# Patient Record
Sex: Female | Born: 1945
Health system: Southern US, Community
[De-identification: ages and names within clinical notes are randomized; demographics above are authoritative.]

## PROBLEM LIST (undated history)

## (undated) DIAGNOSIS — M4800 Spinal stenosis, site unspecified: Secondary | ICD-10-CM

## (undated) DIAGNOSIS — J302 Other seasonal allergic rhinitis: Secondary | ICD-10-CM

## (undated) HISTORY — PX: OTHER SURGICAL HISTORY: SHX169

## (undated) HISTORY — PX: TUBAL LIGATION: SHX77

---

## 1998-04-11 ENCOUNTER — Ambulatory Visit (HOSPITAL_BASED_OUTPATIENT_CLINIC_OR_DEPARTMENT_OTHER): Admission: RE | Admit: 1998-04-11 | Discharge: 1998-04-11 | Payer: Self-pay | Admitting: Orthopedic Surgery

## 1998-06-13 ENCOUNTER — Ambulatory Visit (HOSPITAL_BASED_OUTPATIENT_CLINIC_OR_DEPARTMENT_OTHER): Admission: RE | Admit: 1998-06-13 | Discharge: 1998-06-13 | Payer: Self-pay | Admitting: Orthopedic Surgery

## 2002-05-18 ENCOUNTER — Encounter: Payer: Self-pay | Admitting: Obstetrics & Gynecology

## 2002-05-18 ENCOUNTER — Ambulatory Visit (HOSPITAL_COMMUNITY): Admission: RE | Admit: 2002-05-18 | Discharge: 2002-05-18 | Payer: Self-pay | Admitting: Obstetrics & Gynecology

## 2003-05-28 ENCOUNTER — Ambulatory Visit (HOSPITAL_COMMUNITY): Admission: RE | Admit: 2003-05-28 | Discharge: 2003-05-28 | Payer: Self-pay | Admitting: Obstetrics & Gynecology

## 2012-03-08 DIAGNOSIS — J309 Allergic rhinitis, unspecified: Secondary | ICD-10-CM | POA: Diagnosis not present

## 2012-03-24 DIAGNOSIS — J309 Allergic rhinitis, unspecified: Secondary | ICD-10-CM | POA: Diagnosis not present

## 2012-04-01 DIAGNOSIS — J309 Allergic rhinitis, unspecified: Secondary | ICD-10-CM | POA: Diagnosis not present

## 2012-04-06 DIAGNOSIS — J3089 Other allergic rhinitis: Secondary | ICD-10-CM | POA: Diagnosis not present

## 2012-04-07 DIAGNOSIS — J309 Allergic rhinitis, unspecified: Secondary | ICD-10-CM | POA: Diagnosis not present

## 2012-04-21 DIAGNOSIS — J309 Allergic rhinitis, unspecified: Secondary | ICD-10-CM | POA: Diagnosis not present

## 2012-04-25 DIAGNOSIS — J309 Allergic rhinitis, unspecified: Secondary | ICD-10-CM | POA: Diagnosis not present

## 2012-05-05 DIAGNOSIS — J309 Allergic rhinitis, unspecified: Secondary | ICD-10-CM | POA: Diagnosis not present

## 2012-05-12 DIAGNOSIS — J309 Allergic rhinitis, unspecified: Secondary | ICD-10-CM | POA: Diagnosis not present

## 2012-05-19 DIAGNOSIS — J309 Allergic rhinitis, unspecified: Secondary | ICD-10-CM | POA: Diagnosis not present

## 2012-05-31 DIAGNOSIS — J309 Allergic rhinitis, unspecified: Secondary | ICD-10-CM | POA: Diagnosis not present

## 2012-05-31 DIAGNOSIS — Z23 Encounter for immunization: Secondary | ICD-10-CM | POA: Diagnosis not present

## 2012-06-06 DIAGNOSIS — R319 Hematuria, unspecified: Secondary | ICD-10-CM | POA: Diagnosis not present

## 2012-06-06 DIAGNOSIS — R109 Unspecified abdominal pain: Secondary | ICD-10-CM | POA: Diagnosis not present

## 2012-06-08 DIAGNOSIS — J309 Allergic rhinitis, unspecified: Secondary | ICD-10-CM | POA: Diagnosis not present

## 2012-06-09 ENCOUNTER — Ambulatory Visit (HOSPITAL_COMMUNITY)
Admission: RE | Admit: 2012-06-09 | Discharge: 2012-06-09 | Disposition: A | Discharge status: Home / Self Care | Payer: Medicare Other | Source: Ambulatory Visit | Attending: Internal Medicine | Admitting: Internal Medicine

## 2012-06-09 ENCOUNTER — Other Ambulatory Visit (HOSPITAL_COMMUNITY): Payer: Self-pay | Admitting: Internal Medicine

## 2012-06-09 DIAGNOSIS — R109 Unspecified abdominal pain: Secondary | ICD-10-CM | POA: Diagnosis not present

## 2012-06-09 DIAGNOSIS — R1084 Generalized abdominal pain: Secondary | ICD-10-CM

## 2012-06-09 DIAGNOSIS — M47817 Spondylosis without myelopathy or radiculopathy, lumbosacral region: Secondary | ICD-10-CM | POA: Diagnosis not present

## 2012-06-09 DIAGNOSIS — K5730 Diverticulosis of large intestine without perforation or abscess without bleeding: Secondary | ICD-10-CM | POA: Diagnosis not present

## 2012-06-09 LAB — POCT I-STAT, CHEM 8
BUN: 15 mg/dL (ref 6–23)
Calcium, Ion: 1.23 mmol/L (ref 1.13–1.30)
Chloride: 103 mEq/L (ref 96–112)
Creatinine, Ser: 0.7 mg/dL (ref 0.50–1.10)
Glucose, Bld: 93 mg/dL (ref 70–99)
HCT: 42 % (ref 36.0–46.0)
Hemoglobin: 14.3 g/dL (ref 12.0–15.0)
Potassium: 3.9 mEq/L (ref 3.5–5.1)
Sodium: 140 mEq/L (ref 135–145)
TCO2: 28 mmol/L (ref 0–100)

## 2012-06-09 MED ORDER — IOHEXOL 300 MG/ML  SOLN
100.0000 mL | Freq: Once | INTRAMUSCULAR | Status: AC | PRN
  Administered 2012-06-09: 100 mL via INTRAVENOUS

## 2012-06-20 DIAGNOSIS — J309 Allergic rhinitis, unspecified: Secondary | ICD-10-CM | POA: Diagnosis not present

## 2012-06-28 ENCOUNTER — Encounter (INDEPENDENT_AMBULATORY_CARE_PROVIDER_SITE_OTHER): Payer: Self-pay | Admitting: *Deleted

## 2012-06-28 ENCOUNTER — Emergency Department (HOSPITAL_BASED_OUTPATIENT_CLINIC_OR_DEPARTMENT_OTHER)
Admission: EM | Admit: 2012-06-28 | Discharge: 2012-06-28 | Disposition: A | Discharge status: Home / Self Care | Payer: Medicare Other | Attending: Emergency Medicine | Admitting: Emergency Medicine

## 2012-06-28 ENCOUNTER — Encounter (HOSPITAL_BASED_OUTPATIENT_CLINIC_OR_DEPARTMENT_OTHER): Payer: Self-pay | Admitting: *Deleted

## 2012-06-28 ENCOUNTER — Emergency Department (HOSPITAL_BASED_OUTPATIENT_CLINIC_OR_DEPARTMENT_OTHER): Payer: Medicare Other

## 2012-06-28 DIAGNOSIS — Z79899 Other long term (current) drug therapy: Secondary | ICD-10-CM | POA: Insufficient documentation

## 2012-06-28 DIAGNOSIS — M549 Dorsalgia, unspecified: Secondary | ICD-10-CM | POA: Diagnosis not present

## 2012-06-28 DIAGNOSIS — R142 Eructation: Secondary | ICD-10-CM | POA: Insufficient documentation

## 2012-06-28 DIAGNOSIS — R141 Gas pain: Secondary | ICD-10-CM | POA: Insufficient documentation

## 2012-06-28 DIAGNOSIS — R109 Unspecified abdominal pain: Secondary | ICD-10-CM | POA: Diagnosis not present

## 2012-06-28 DIAGNOSIS — R1011 Right upper quadrant pain: Secondary | ICD-10-CM | POA: Diagnosis not present

## 2012-06-28 LAB — CBC WITH DIFFERENTIAL/PLATELET
Basophils Relative: 0 % (ref 0–1)
Eosinophils Absolute: 0.2 10*3/uL (ref 0.0–0.7)
Eosinophils Relative: 2 % (ref 0–5)
HCT: 38.4 % (ref 36.0–46.0)
Hemoglobin: 12.9 g/dL (ref 12.0–15.0)
MCH: 29.9 pg (ref 26.0–34.0)
MCHC: 33.6 g/dL (ref 30.0–36.0)
Monocytes Absolute: 0.5 10*3/uL (ref 0.1–1.0)
Monocytes Relative: 7 % (ref 3–12)

## 2012-06-28 LAB — URINALYSIS, ROUTINE W REFLEX MICROSCOPIC
Bilirubin Urine: NEGATIVE
Glucose, UA: NEGATIVE mg/dL
Ketones, ur: NEGATIVE mg/dL
Nitrite: NEGATIVE
Protein, ur: NEGATIVE mg/dL
Specific Gravity, Urine: 1.016 (ref 1.005–1.030)
Urobilinogen, UA: 1 mg/dL (ref 0.0–1.0)
pH: 6 (ref 5.0–8.0)

## 2012-06-28 LAB — COMPREHENSIVE METABOLIC PANEL
Albumin: 3.8 g/dL (ref 3.5–5.2)
BUN: 13 mg/dL (ref 6–23)
Creatinine, Ser: 0.6 mg/dL (ref 0.50–1.10)
Total Protein: 7.2 g/dL (ref 6.0–8.3)

## 2012-06-28 LAB — URINE MICROSCOPIC-ADD ON

## 2012-06-28 MED ORDER — HYDROCODONE-ACETAMINOPHEN 5-325 MG PO TABS: 2.0000 | ORAL_TABLET | ORAL | Status: DC | PRN

## 2012-06-28 MED ORDER — CIPROFLOXACIN HCL 500 MG PO TABS: 500.0000 mg | ORAL_TABLET | Freq: Two times a day (BID) | ORAL | Status: DC

## 2012-06-28 NOTE — ED Provider Notes (Signed)
History     CSN: [id number]  Arrival date & time 06/28/12  1348   First MD Initiated Contact with Patient 06/28/12 1508      Chief Complaint  Patient presents with  . Flank Pain    (Consider location/radiation/quality/duration/timing/severity/associated sxs/prior treatment) HPI Comments: Pt with 3 week hx of right flank pain.  Has a constant burning pain to area that waxes and wanes in intensity.  No urinary symptoms.  No related to eating.  Normal bowel movements.  No fevers.  No rashes.  Slight worse with movement.  Pain is a sharp, burning pain with associated numbness to RUQ, radiates to upper back.  Was seen by her PMD at [facility], had CT scan that did not show etiology for the pain.  Was taking some pain medications with some relief.  Has appt on Jan 9th to see a GI specialist.  Patient is a 66 y.o. female presenting with flank pain.  Flank Pain Associated symptoms include abdominal pain. Pertinent negatives include no chest pain, no headaches and no shortness of breath.    History reviewed. No pertinent past medical history.  History reviewed. No pertinent past surgical history.  History reviewed. No pertinent family history.  History  Substance Use Topics  . Smoking status: Never Smoker   . Smokeless tobacco: Not on file  . Alcohol Use: No    OB History    Grav Para Term Preterm Abortions TAB SAB Ect Mult Living                  Review of Systems  Constitutional: Negative for fever, chills, diaphoresis and fatigue.  HENT: Negative for congestion, rhinorrhea and sneezing.   Eyes: Negative.   Respiratory: Negative for cough, chest tightness and shortness of breath.   Cardiovascular: Negative for chest pain and leg swelling.  Gastrointestinal: Positive for abdominal pain and abdominal distention. Negative for nausea, vomiting, diarrhea and blood in stool.  Genitourinary: Negative for frequency, hematuria, flank pain and difficulty urinating.  Musculoskeletal:  Positive for back pain. Negative for arthralgias.  Skin: Negative for rash.  Neurological: Negative for dizziness, speech difficulty, weakness, numbness and headaches.    Allergies  Review of patient's allergies indicates no known allergies.  Home Medications   Current Outpatient Rx  Name  Route  Sig  Dispense  Refill  . CIPROFLOXACIN HCL 500 MG PO TABS   Oral   Take 1 tablet (500 mg total) by mouth 2 (two) times daily.   14 tablet   0   . HYDROCODONE-ACETAMINOPHEN 5-325 MG PO TABS   Oral   Take 2 tablets by mouth every 4 (four) hours as needed for pain.   15 tablet   0     BP 138/83  Pulse 78  Temp 98.6 F (37 C) (Oral)  Resp 16  SpO2 98%  Physical Exam  Constitutional: She is oriented to person, place, and time. She appears well-developed and well-nourished.  HENT:  Head: Normocephalic and atraumatic.  Eyes: Pupils are equal, round, and reactive to light.  Neck: Normal range of motion. Neck supple.  Cardiovascular: Normal rate, regular rhythm and normal heart sounds.   Pulmonary/Chest: Effort normal and breath sounds normal. No respiratory distress. She has no wheezes. She has no rales. She exhibits no tenderness.  Abdominal: Soft. Bowel sounds are normal. There is tenderness (mild TTP right mid abdomen/upper abdomen.  no CVA tenderness.  no rash.  no pain along spine). There is no rebound and no guarding.  Musculoskeletal: Normal range of motion. She exhibits no edema.  Lymphadenopathy:    She has no cervical adenopathy.  Neurological: She is alert and oriented to person, place, and time.  Skin: Skin is warm and dry. No rash noted.  Psychiatric: She has a normal mood and affect.    ED Course  Procedures (including critical care time)  Results for orders placed during the hospital encounter of 06/28/12  URINALYSIS, ROUTINE W REFLEX MICROSCOPIC      Component Value Range   Color, Urine YELLOW  YELLOW   APPearance CLEAR  CLEAR   Specific Gravity, Urine 1.016   1.005 - 1.030   pH 6.0  5.0 - 8.0   Glucose, UA NEGATIVE  NEGATIVE mg/dL   Hgb urine dipstick SMALL (*) NEGATIVE   Bilirubin Urine NEGATIVE  NEGATIVE   Ketones, ur NEGATIVE  NEGATIVE mg/dL   Protein, ur NEGATIVE  NEGATIVE mg/dL   Urobilinogen, UA 1.0  0.0 - 1.0 mg/dL   Nitrite NEGATIVE  NEGATIVE   Leukocytes, UA SMALL (*) NEGATIVE  URINE MICROSCOPIC-ADD ON      Component Value Range   Squamous Epithelial / LPF FEW (*) RARE   WBC, UA 11-20  <3 WBC/hpf   RBC / HPF 0-2  <3 RBC/hpf   Bacteria, UA FEW (*) RARE  CBC WITH DIFFERENTIAL      Component Value Range   WBC 7.9  4.0 - 10.5 K/uL   RBC 4.31  3.87 - 5.11 MIL/uL   Hemoglobin 12.9  12.0 - 15.0 g/dL   HCT 40.9  81.1 - 91.4 %   MCV 89.1  78.0 - 100.0 fL   MCH 29.9  26.0 - 34.0 pg   MCHC 33.6  30.0 - 36.0 g/dL   RDW 78.2  95.6 - 21.3 %   Platelets 231  150 - 400 K/uL   Neutrophils Relative 64  43 - 77 %   Neutro Abs 5.1  1.7 - 7.7 K/uL   Lymphocytes Relative 26  12 - 46 %   Lymphs Abs 2.1  0.7 - 4.0 K/uL   Monocytes Relative 7  3 - 12 %   Monocytes Absolute 0.5  0.1 - 1.0 K/uL   Eosinophils Relative 2  0 - 5 %   Eosinophils Absolute 0.2  0.0 - 0.7 K/uL   Basophils Relative 0  0 - 1 %   Basophils Absolute 0.0  0.0 - 0.1 K/uL  COMPREHENSIVE METABOLIC PANEL      Component Value Range   Sodium 139  135 - 145 mEq/L   Potassium 4.1  3.5 - 5.1 mEq/L   Chloride 101  96 - 112 mEq/L   CO2 26  19 - 32 mEq/L   Glucose, Bld 97  70 - 99 mg/dL   BUN 13  6 - 23 mg/dL   Creatinine, Ser 0.86  0.50 - 1.10 mg/dL   Calcium 9.3  8.4 - 57.8 mg/dL   Total Protein 7.2  6.0 - 8.3 g/dL   Albumin 3.8  3.5 - 5.2 g/dL   AST 15  0 - 37 U/L   ALT 18  0 - 35 U/L   Alkaline Phosphatase 89  39 - 117 U/L   Total Bilirubin 0.3  0.3 - 1.2 mg/dL   GFR calc non Af Amer >90  >90 mL/min   GFR calc Af Amer >90  >90 mL/min   US Abdomen Complete  06/28/2012  *RADIOLOGY REPORT*  Clinical Data:  Right upper quadrant pain.  COMPLETE ABDOMINAL ULTRASOUND   Comparison:  CT 06/09/2012  Findings:  Gallbladder:  No gallstones, gallbladder wall thickening, or pericholecystic fluid.  Common bile duct:   Normal caliber, 5 mm.  Liver:  No focal lesion identified.  Within normal limits in parenchymal echogenicity.  IVC:  Appears normal.  Pancreas:  No focal abnormality seen.  Spleen:  Within normal limits in size and echotexture.  Right Kidney:   Normal in size and parenchymal echogenicity.  No evidence of mass or hydronephrosis.  Left Kidney:  Areas of cortical thinning/scarring in the mid to lower pole as seen on prior CT.  11.4 cm in craniocaudal length. No hydronephrosis.  Abdominal aorta:  No aneurysm identified.  IMPRESSION: No acute findings or significant abnormality.   Original Report Authenticated By: Charlett Nose, M.D.    Ct Abdomen Pelvis W Contrast  06/09/2012  *RADIOLOGY REPORT*  Clinical Data: Intermittent right-sided abdominal pain and numbness.  CT ABDOMEN AND PELVIS WITH CONTRAST  Technique:  Multidetector CT imaging of the abdomen and pelvis was performed following the standard protocol during bolus administration of intravenous contrast.  Contrast: OMNIPAQUE IOHEXOL 300 MG/ML  SOLN  Comparison: None.  Findings: The lung bases are clear without focal nodule, mass, or airspace disease.  The heart size is normal.  No significant pleural or pericardial effusion is present.  The liver and spleen are within normal limits.  The stomach, stomach is within normal limits.  A small duodenal diverticulum is present without associated inflammatory change.  Mild fatty infiltration of the pancreas is present.  There are no focal pancreatic lesions.  The common bile duct and gallbladder are within normal limits.  The adrenal glands are normal bilaterally. Mild renal atrophy is evident within the anterolateral left kidney. This could be secondary to prior infection.  Ureters are within normal limits.  Urinary bladder is normal.  The rectosigmoid colon demonstrates  mild diverticular changes without focal inflammation.  Diverticular changes are present throughout the descending colon as well.  The more proximal colon is within normal limits.  The appendix is not discretely visualized and may be surgically absent.  The small bowel is unremarkable. The uterus and adnexa are within normal limits for age.  No significant adenopathy or free fluid is present.  The bone windows demonstrate a moderate facet degenerative changes and anterolisthesis at L4-5.  Slight retrolisthesis is present at L2-3.  Multilevel endplate changes are noted.  Slight leftward curvature is evident.  Chronic end plate changes are present at T10- 11.  Degenerative changes are noted in the SI joints bilaterally.  IMPRESSION:  1.  Sigmoid diverticulosis without diverticulitis. 2.  Moderate spondylosis in the lumbar spine. 3.  No acute or focal abnormality to explain the patient's right- sided symptoms. 4.  Small duodenal diverticulum without focal inflammatory changes to suggest duodenitis. 5.  The appendix is not clearly visualized although no secondary signs of appendicitis are evident.   Original Report Authenticated By: Marin Roberts, M.D.       1. Abdominal  pain, other specified site       MDM  Patient with three-week history of right flank pain. She had a recent CT scan is unremarkable. I did do an ultrasound today that did not show gallbladder disease. Her labs are unremarkable. Her urine has some signs of infection so I feel it is worthwhile treating her with a course of antibiotics for this. Her urine was also sent for culture. She has a followup appointment with the GI  specialist. I advised to return here if her symptoms worsen.        Rolan Bucco, MD 06/28/12 [phone]

## 2012-06-28 NOTE — ED Notes (Signed)
Pt states that she has been having right sided flank and abd pain states that pain is a burning and stinging pain as well as numbness. Pt state that she has seen her PCP and had a CT Scan at [facility] that was -. Pt states that she feels as though she is not urinating as much as she should

## 2012-07-01 LAB — URINE CULTURE

## 2012-07-02 NOTE — ED Notes (Signed)
+  Urine. Patient treated with Cipro. Sensitive to same. Per protocol MD. °

## 2012-07-05 DIAGNOSIS — R1031 Right lower quadrant pain: Secondary | ICD-10-CM | POA: Diagnosis not present

## 2012-07-05 DIAGNOSIS — N949 Unspecified condition associated with female genital organs and menstrual cycle: Secondary | ICD-10-CM | POA: Diagnosis not present

## 2012-07-05 DIAGNOSIS — Z124 Encounter for screening for malignant neoplasm of cervix: Secondary | ICD-10-CM | POA: Diagnosis not present

## 2012-07-05 DIAGNOSIS — N8500 Endometrial hyperplasia, unspecified: Secondary | ICD-10-CM | POA: Diagnosis not present

## 2012-07-05 DIAGNOSIS — N9489 Other specified conditions associated with female genital organs and menstrual cycle: Secondary | ICD-10-CM | POA: Diagnosis not present

## 2012-07-07 ENCOUNTER — Telehealth (INDEPENDENT_AMBULATORY_CARE_PROVIDER_SITE_OTHER): Payer: Self-pay | Admitting: *Deleted

## 2012-07-07 ENCOUNTER — Encounter (INDEPENDENT_AMBULATORY_CARE_PROVIDER_SITE_OTHER): Payer: Self-pay | Admitting: Internal Medicine

## 2012-07-07 ENCOUNTER — Ambulatory Visit (INDEPENDENT_AMBULATORY_CARE_PROVIDER_SITE_OTHER): Payer: Medicare Other | Admitting: Internal Medicine

## 2012-07-07 ENCOUNTER — Other Ambulatory Visit (INDEPENDENT_AMBULATORY_CARE_PROVIDER_SITE_OTHER): Payer: Self-pay | Admitting: *Deleted

## 2012-07-07 VITALS — BP 144/82 | HR 64 | Temp 98.2°F | Ht 67.5 in | Wt 224.0 lb

## 2012-07-07 DIAGNOSIS — R52 Pain, unspecified: Secondary | ICD-10-CM | POA: Diagnosis not present

## 2012-07-07 DIAGNOSIS — N949 Unspecified condition associated with female genital organs and menstrual cycle: Secondary | ICD-10-CM | POA: Diagnosis not present

## 2012-07-07 DIAGNOSIS — R1031 Right lower quadrant pain: Secondary | ICD-10-CM | POA: Insufficient documentation

## 2012-07-07 DIAGNOSIS — Z1211 Encounter for screening for malignant neoplasm of colon: Secondary | ICD-10-CM

## 2012-07-07 DIAGNOSIS — N9489 Other specified conditions associated with female genital organs and menstrual cycle: Secondary | ICD-10-CM | POA: Diagnosis not present

## 2012-07-07 DIAGNOSIS — N8500 Endometrial hyperplasia, unspecified: Secondary | ICD-10-CM | POA: Diagnosis not present

## 2012-07-07 MED ORDER — PEG-KCL-NACL-NASULF-NA ASC-C 100 G PO SOLR: 1.0000 | Freq: Once | ORAL | Status: DC

## 2012-07-07 NOTE — Telephone Encounter (Signed)
Patient needs movi prep 

## 2012-07-07 NOTE — Patient Instructions (Addendum)
Screening colonoscopy. Keep appt with Dr. Despina Hidden

## 2012-07-07 NOTE — Progress Notes (Signed)
Subjective:     Patient ID: Dana Macdonald, female   DOB: 1946/04/09, 67 y.o.   MRN: [id number]  HPI   Referred to our office Dr. Sherwood Gambler for rt lower quadrant pain. She tells me she had pan and numbness rt flank radiating down into her rt lower quadrant.she denies any trauma.   Pain started 06/06/2012. She also was seen at [facility] ED (part of the [facility]). And was told she had bacteria in her urine and was placed on Cipro. She saw Dr. Despina Hidden 07/05/2012 and urinalysis showed blood.  She received an injection of Bupivacaine into rt lower quadrant marked with an X per patient. Pelivic exam done. She was told her pectoral muscle was twisted. Rectal exam negative for blood at Dr. Forestine Chute office. She has an appt with Dr. Despina Hidden this afternoon for a lower abdominal US.  At present, she feels better.  Appetite is good. No weight loss.BMs are normal. No melena or bright red rectal bleeding. No urinary frequency. Last colonoscopy: none.    CBC    Component Value Date/Time   WBC 7.9 06/28/2012 1600   RBC 4.31 06/28/2012 1600   HGB 12.9 06/28/2012 1600   HCT 38.4 06/28/2012 1600   PLT 231 06/28/2012 1600   MCV 89.1 06/28/2012 1600   MCH 29.9 06/28/2012 1600   MCHC 33.6 06/28/2012 1600   RDW 12.9 06/28/2012 1600   LYMPHSABS 2.1 06/28/2012 1600   MONOABS 0.5 06/28/2012 1600   EOSABS 0.2 06/28/2012 1600   BASOSABS 0.0 06/28/2012 1600   CMP     Component Value Date/Time   NA 139 06/28/2012 1600   K 4.1 06/28/2012 1600   CL 101 06/28/2012 1600   CO2 26 06/28/2012 1600   GLUCOSE 97 06/28/2012 1600   BUN 13 06/28/2012 1600   CREATININE 0.60 06/28/2012 1600   CALCIUM 9.3 06/28/2012 1600   PROT 7.2 06/28/2012 1600   ALBUMIN 3.8 06/28/2012 1600   AST 15 06/28/2012 1600   ALT 18 06/28/2012 1600   ALKPHOS 89 06/28/2012 1600   BILITOT 0.3 06/28/2012 1600   GFRNONAA >90 06/28/2012 1600   GFRAA >90 06/28/2012 1600       06/09/2012 CT abdomen/pelvis with CM:1. Sigmoid diverticulosis without  diverticulitis.  2. Moderate spondylosis in the lumbar spine.  3. No acute or focal abnormality to explain the patient's right-  sided symptoms.  4. Small duodenal diverticulum without focal inflammatory changes  to suggest duodenitis.  5. The appendix is not clearly visualized although no secondary  signs of appendicitis are evident. 06/28/2012 US abdomen:IMPRESSION:  No acute findings or significant abnormality. CBD 5mm.  Review of Systems see hpi Current Outpatient Prescriptions  Medication Sig Dispense Refill  . HYDROcodone-acetaminophen (NORCO/VICODIN) 5-325 MG per tablet Take 2 tablets by mouth every 4 (four) hours as needed for pain.  15 tablet  0   History reviewed. No pertinent past medical history. Past Surgical History  Procedure Date  . Tubal ligation     early 23s  . Torn achilles     bilaterally in the 90s        Objective:   Physical Exam Filed Vitals:   07/07/12 1106  BP: 144/82  Pulse: 64  Temp: 98.2 F (36.8 C)  Height: 5' 7.5" (1.715 m)  Weight: 224 lb (101.606 kg)   Alert and oriented. Skin warm and dry. Oral mucosa is moist.   . Sclera anicteric, conjunctivae is pink. Thyroid not enlarged. No cervical lymphadenopathy. Lungs clear. Heart regular  rate and rhythm.  Abdomen is soft. Bowel sounds are positive. No hepatomegaly. No abdominal masses felt. Very slight point tenderness at injection site in rt lower quadrant.  No edema to lower extremities.      Assessment:    Rt lower quadrant pain probably musculoskeletal pain. All labs and xray are normal.  In needs of screening colonoscopy    Plan:    Colonoscopy

## 2012-07-11 DIAGNOSIS — J309 Allergic rhinitis, unspecified: Secondary | ICD-10-CM | POA: Diagnosis not present

## 2012-07-21 DIAGNOSIS — R1031 Right lower quadrant pain: Secondary | ICD-10-CM | POA: Diagnosis not present

## 2012-07-22 DIAGNOSIS — J301 Allergic rhinitis due to pollen: Secondary | ICD-10-CM | POA: Diagnosis not present

## 2012-07-22 DIAGNOSIS — H1045 Other chronic allergic conjunctivitis: Secondary | ICD-10-CM | POA: Diagnosis not present

## 2012-07-22 DIAGNOSIS — J3089 Other allergic rhinitis: Secondary | ICD-10-CM | POA: Diagnosis not present

## 2012-07-25 DIAGNOSIS — J309 Allergic rhinitis, unspecified: Secondary | ICD-10-CM | POA: Diagnosis not present

## 2012-08-01 DIAGNOSIS — J309 Allergic rhinitis, unspecified: Secondary | ICD-10-CM | POA: Diagnosis not present

## 2012-08-09 DIAGNOSIS — M48061 Spinal stenosis, lumbar region without neurogenic claudication: Secondary | ICD-10-CM | POA: Diagnosis not present

## 2012-08-13 DIAGNOSIS — M47817 Spondylosis without myelopathy or radiculopathy, lumbosacral region: Secondary | ICD-10-CM | POA: Diagnosis not present

## 2012-08-15 DIAGNOSIS — M48061 Spinal stenosis, lumbar region without neurogenic claudication: Secondary | ICD-10-CM | POA: Diagnosis not present

## 2012-08-19 DIAGNOSIS — M48061 Spinal stenosis, lumbar region without neurogenic claudication: Secondary | ICD-10-CM | POA: Diagnosis not present

## 2012-08-19 DIAGNOSIS — M47817 Spondylosis without myelopathy or radiculopathy, lumbosacral region: Secondary | ICD-10-CM | POA: Diagnosis not present

## 2012-08-23 DIAGNOSIS — J309 Allergic rhinitis, unspecified: Secondary | ICD-10-CM | POA: Diagnosis not present

## 2012-08-25 ENCOUNTER — Ambulatory Visit (HOSPITAL_COMMUNITY)
Admission: RE | Admit: 2012-08-25 | Discharge: 2012-08-25 | Disposition: A | Discharge status: Home Health | Payer: Medicare Other | Source: Ambulatory Visit | Attending: Internal Medicine | Admitting: Internal Medicine

## 2012-08-25 ENCOUNTER — Encounter (HOSPITAL_COMMUNITY): Payer: Self-pay | Admitting: *Deleted

## 2012-08-25 ENCOUNTER — Encounter (HOSPITAL_COMMUNITY)
Admission: RE | Disposition: A | Discharge status: Home Health | Payer: Self-pay | Source: Ambulatory Visit | Attending: Internal Medicine

## 2012-08-25 DIAGNOSIS — K5730 Diverticulosis of large intestine without perforation or abscess without bleeding: Secondary | ICD-10-CM | POA: Insufficient documentation

## 2012-08-25 DIAGNOSIS — K648 Other hemorrhoids: Secondary | ICD-10-CM | POA: Insufficient documentation

## 2012-08-25 DIAGNOSIS — K633 Ulcer of intestine: Secondary | ICD-10-CM | POA: Insufficient documentation

## 2012-08-25 DIAGNOSIS — Z1211 Encounter for screening for malignant neoplasm of colon: Secondary | ICD-10-CM | POA: Diagnosis not present

## 2012-08-25 DIAGNOSIS — K644 Residual hemorrhoidal skin tags: Secondary | ICD-10-CM

## 2012-08-25 DIAGNOSIS — K6389 Other specified diseases of intestine: Secondary | ICD-10-CM

## 2012-08-25 DIAGNOSIS — R1031 Right lower quadrant pain: Secondary | ICD-10-CM

## 2012-08-25 HISTORY — DX: Spinal stenosis, site unspecified: M48.00

## 2012-08-25 HISTORY — PX: COLONOSCOPY: SHX5424

## 2012-08-25 HISTORY — DX: Other seasonal allergic rhinitis: J30.2

## 2012-08-25 SURGERY — COLONOSCOPY
Anesthesia: Moderate Sedation

## 2012-08-25 MED ORDER — SODIUM CHLORIDE 0.45 % IV SOLN
INTRAVENOUS | Status: DC
  Administered 2012-08-25: 1000 mL via INTRAVENOUS

## 2012-08-25 MED ORDER — MIDAZOLAM HCL 5 MG/5ML IJ SOLN
INTRAMUSCULAR | Status: AC
  Filled 2012-08-25: qty 10

## 2012-08-25 MED ORDER — MEPERIDINE HCL 50 MG/ML IJ SOLN
INTRAMUSCULAR | Status: DC | PRN
  Administered 2012-08-25 (×2): 25 mg via INTRAVENOUS

## 2012-08-25 MED ORDER — SIMETHICONE 40 MG/0.6ML PO SUSP
ORAL | Status: DC | PRN
  Administered 2012-08-25: 08:00:00

## 2012-08-25 MED ORDER — MEPERIDINE HCL 50 MG/ML IJ SOLN
INTRAMUSCULAR | Status: AC
  Filled 2012-08-25: qty 1

## 2012-08-25 MED ORDER — MIDAZOLAM HCL 5 MG/5ML IJ SOLN
INTRAMUSCULAR | Status: DC | PRN
  Administered 2012-08-25 (×2): 3 mg via INTRAVENOUS
  Administered 2012-08-25: 2 mg via INTRAVENOUS

## 2012-08-25 NOTE — H&P (Signed)
Dana Macdonald is an 67 y.o. female.   Chief Complaint: Patient is here for colonoscopy. HPI: Patient is 67 year old Caucasian female who is undergoing screening colonoscopy. She was recently evaluated for abdominal pain felt to be secondary to spinal stenosis. Pain has resolved. She denies rectal bleeding or change in her bowel habits. Family history is negative for colorectal carcinoma. This is patient's first exam.  Past Medical History  Diagnosis Date  . Spinal stenosis   . Seasonal allergies     Past Surgical History  Procedure Laterality Date  . Tubal ligation      early 102s  . Torn achilles      bilaterally in the 90s    History reviewed. No pertinent family history. Social History:  reports that she has never smoked. She does not have any smokeless tobacco history on file. She reports that  drinks alcohol. She reports that she does not use illicit drugs.  Allergies: No Known Allergies  Medications Prior to Admission  Medication Sig Dispense Refill  . HYDROcodone-acetaminophen (NORCO/VICODIN) 5-325 MG per tablet Take 2 tablets by mouth every 4 (four) hours as needed for pain.  15 tablet  0  . naproxen sodium (ANAPROX) 220 MG tablet Take 220 mg by mouth 2 (two) times daily as needed.      . peg 3350 powder (MOVIPREP) 100 G SOLR Take 1 kit (100 g total) by mouth once.  1 kit  0    No results found for this or any previous visit (from the past 48 hour(s)). No results found.  ROS  Blood pressure 140/85, pulse 86, temperature 97.2 F (36.2 C), temperature source Oral, resp. rate 20, height 5\' 8"  (1.727 m), weight 210 lb (95.255 kg), SpO2 98.00%. Physical Exam  Constitutional: She appears well-developed and well-nourished.  HENT:  Mouth/Throat: Oropharynx is clear and moist.  Eyes: Conjunctivae are normal. No scleral icterus.  Neck: No thyromegaly present.  Cardiovascular: Normal rate, regular rhythm and normal heart sounds.   No murmur heard. Respiratory: Effort  normal and breath sounds normal.  GI: Soft. She exhibits no distension and no mass. There is no tenderness.  Musculoskeletal: She exhibits no edema.  Lymphadenopathy:    She has no cervical adenopathy.  Neurological: She is alert.  Skin: Skin is warm and dry.     Assessment/Plan Average risk screening colonoscopy.  Dana Macdonald U 08/25/2012, 8:17 AM

## 2012-08-25 NOTE — Op Note (Signed)
COLONOSCOPY PROCEDURE REPORT  PATIENT:  Dana Macdonald  MR#:  [id number] Birthdate:  Feb 27, 1946, 67 y.o., female Endoscopist:  Dr. Malissa Hippo, MD Referred By:  Dr. Madelin Rear. Sherwood Gambler, MD Procedure Date: 08/25/2012  Procedure:   Colonoscopy  Indications:  Patient is 67 year old Caucasian female who is undergoing average risk screening colonoscopy.  Informed Consent:  The procedure and risks were reviewed with the patient and informed consent was obtained.  Medications:  Demerol 50 mg IV Versed 8 mg IV  Description of procedure:  After a digital rectal exam was performed, that colonoscope was advanced from the anus through the rectum and colon to the area of the cecum, ileocecal valve and appendiceal orifice. The cecum was deeply intubated. These structures were well-seen and photographed for the record. From the level of the cecum and ileocecal valve, the scope was slowly and cautiously withdrawn. The mucosal surfaces were carefully surveyed utilizing scope tip to flexion to facilitate fold flattening as needed. The scope was pulled down into the rectum where a thorough exam including retroflexion was performed. Short segment of TI was also examined.  Findings:   Prep excellent. Normal mucosa of terminal ileum. Scattered erosions noted at cecum. Biopsy taken. Scattered diverticula at transverse colon. Normal rectal mucosa. Small hemorrhoids below the dentate line.  Therapeutic/Diagnostic Maneuvers Performed:  See above  Complications:  None  Cecal Withdrawal Time:  13 minutes  Impression:  Normal terminal ileum. Scattered cecal erosions most likely an NSAID mediated injury. Transverse colon diverticulosis. Small external hemorrhoids.  Recommendations:  Standard instructions given. Patient advised to keep NSAID use the minimum. I will contact patient with biopsy results and further recommendations. Next screening exam in 10 years.  REHMAN,NAJEEB U  08/25/2012 8:54  AM  CC: Dr. Cassell Smiles., MD & Dr. Bonnetta Barry ref. provider found

## 2012-08-29 ENCOUNTER — Encounter (HOSPITAL_COMMUNITY): Payer: Self-pay | Admitting: Internal Medicine

## 2012-08-31 ENCOUNTER — Encounter (INDEPENDENT_AMBULATORY_CARE_PROVIDER_SITE_OTHER): Payer: Self-pay | Admitting: *Deleted

## 2012-08-31 DIAGNOSIS — J309 Allergic rhinitis, unspecified: Secondary | ICD-10-CM | POA: Diagnosis not present

## 2012-09-05 DIAGNOSIS — M48061 Spinal stenosis, lumbar region without neurogenic claudication: Secondary | ICD-10-CM | POA: Diagnosis not present

## 2012-09-05 DIAGNOSIS — M545 Low back pain, unspecified: Secondary | ICD-10-CM | POA: Diagnosis not present

## 2012-09-08 DIAGNOSIS — J309 Allergic rhinitis, unspecified: Secondary | ICD-10-CM | POA: Diagnosis not present

## 2012-09-14 DIAGNOSIS — M48061 Spinal stenosis, lumbar region without neurogenic claudication: Secondary | ICD-10-CM | POA: Diagnosis not present

## 2012-09-15 DIAGNOSIS — J309 Allergic rhinitis, unspecified: Secondary | ICD-10-CM | POA: Diagnosis not present

## 2012-09-21 DIAGNOSIS — M48061 Spinal stenosis, lumbar region without neurogenic claudication: Secondary | ICD-10-CM | POA: Diagnosis not present

## 2012-09-21 DIAGNOSIS — J309 Allergic rhinitis, unspecified: Secondary | ICD-10-CM | POA: Diagnosis not present

## 2012-09-21 DIAGNOSIS — M47817 Spondylosis without myelopathy or radiculopathy, lumbosacral region: Secondary | ICD-10-CM | POA: Diagnosis not present

## 2012-09-27 DIAGNOSIS — M545 Low back pain: Secondary | ICD-10-CM | POA: Diagnosis not present

## 2012-09-27 DIAGNOSIS — M47817 Spondylosis without myelopathy or radiculopathy, lumbosacral region: Secondary | ICD-10-CM | POA: Diagnosis not present

## 2012-09-27 DIAGNOSIS — M48061 Spinal stenosis, lumbar region without neurogenic claudication: Secondary | ICD-10-CM | POA: Diagnosis not present

## 2012-10-06 DIAGNOSIS — M48061 Spinal stenosis, lumbar region without neurogenic claudication: Secondary | ICD-10-CM | POA: Diagnosis not present

## 2012-10-06 DIAGNOSIS — M47817 Spondylosis without myelopathy or radiculopathy, lumbosacral region: Secondary | ICD-10-CM | POA: Diagnosis not present

## 2012-10-07 DIAGNOSIS — J309 Allergic rhinitis, unspecified: Secondary | ICD-10-CM | POA: Diagnosis not present

## 2012-10-12 DIAGNOSIS — M48061 Spinal stenosis, lumbar region without neurogenic claudication: Secondary | ICD-10-CM | POA: Diagnosis not present

## 2012-10-12 DIAGNOSIS — M47817 Spondylosis without myelopathy or radiculopathy, lumbosacral region: Secondary | ICD-10-CM | POA: Diagnosis not present

## 2012-10-24 DIAGNOSIS — J309 Allergic rhinitis, unspecified: Secondary | ICD-10-CM | POA: Diagnosis not present

## 2012-11-01 DIAGNOSIS — J309 Allergic rhinitis, unspecified: Secondary | ICD-10-CM | POA: Diagnosis not present

## 2012-11-15 DIAGNOSIS — J309 Allergic rhinitis, unspecified: Secondary | ICD-10-CM | POA: Diagnosis not present

## 2012-11-23 DIAGNOSIS — J309 Allergic rhinitis, unspecified: Secondary | ICD-10-CM | POA: Diagnosis not present

## 2012-12-07 DIAGNOSIS — J309 Allergic rhinitis, unspecified: Secondary | ICD-10-CM | POA: Diagnosis not present

## 2012-12-15 DIAGNOSIS — J309 Allergic rhinitis, unspecified: Secondary | ICD-10-CM | POA: Diagnosis not present

## 2012-12-22 DIAGNOSIS — J309 Allergic rhinitis, unspecified: Secondary | ICD-10-CM | POA: Diagnosis not present

## 2012-12-23 DIAGNOSIS — J309 Allergic rhinitis, unspecified: Secondary | ICD-10-CM | POA: Diagnosis not present

## 2012-12-28 DIAGNOSIS — J309 Allergic rhinitis, unspecified: Secondary | ICD-10-CM | POA: Diagnosis not present

## 2013-01-05 DIAGNOSIS — J309 Allergic rhinitis, unspecified: Secondary | ICD-10-CM | POA: Diagnosis not present

## 2013-01-16 ENCOUNTER — Emergency Department (HOSPITAL_COMMUNITY)
Admission: EM | Admit: 2013-01-16 | Discharge: 2013-01-16 | Disposition: A | Discharge status: Home / Self Care | Payer: Medicare Other | Attending: Emergency Medicine | Admitting: Emergency Medicine

## 2013-01-16 ENCOUNTER — Encounter (HOSPITAL_COMMUNITY): Payer: Self-pay | Admitting: Emergency Medicine

## 2013-01-16 DIAGNOSIS — Y9389 Activity, other specified: Secondary | ICD-10-CM | POA: Insufficient documentation

## 2013-01-16 DIAGNOSIS — H5789 Other specified disorders of eye and adnexa: Secondary | ICD-10-CM | POA: Diagnosis not present

## 2013-01-16 DIAGNOSIS — Z8739 Personal history of other diseases of the musculoskeletal system and connective tissue: Secondary | ICD-10-CM | POA: Diagnosis not present

## 2013-01-16 DIAGNOSIS — IMO0002 Reserved for concepts with insufficient information to code with codable children: Secondary | ICD-10-CM | POA: Insufficient documentation

## 2013-01-16 DIAGNOSIS — S058X9A Other injuries of unspecified eye and orbit, initial encounter: Secondary | ICD-10-CM | POA: Diagnosis not present

## 2013-01-16 DIAGNOSIS — S0502XA Injury of conjunctiva and corneal abrasion without foreign body, left eye, initial encounter: Secondary | ICD-10-CM

## 2013-01-16 DIAGNOSIS — Y929 Unspecified place or not applicable: Secondary | ICD-10-CM | POA: Insufficient documentation

## 2013-01-16 MED ORDER — FLUORESCEIN SODIUM 1 MG OP STRP
ORAL_STRIP | OPHTHALMIC | Status: AC
  Filled 2013-01-16: qty 1

## 2013-01-16 MED ORDER — TETRACAINE HCL 0.5 % OP SOLN
2.0000 [drp] | Freq: Once | OPHTHALMIC | Status: AC
  Administered 2013-01-16: 2 [drp] via OPHTHALMIC
  Filled 2013-01-16: qty 2

## 2013-01-16 MED ORDER — TOBRAMYCIN 0.3 % OP SOLN
1.0000 [drp] | OPHTHALMIC | Status: DC
  Administered 2013-01-16: 1 [drp] via OPHTHALMIC
  Filled 2013-01-16: qty 5

## 2013-01-16 NOTE — ED Provider Notes (Signed)
   History    CSN: [id number] Arrival date & time 01/16/13  0206  First MD Initiated Contact with Patient 01/16/13 [phone]     Chief Complaint  Patient presents with  . Eye Injury    Patient is a 67 y.o. female presenting with eye injury. The history is provided by the patient.  Eye Injury This is a new problem. The current episode started 6 to 12 hours ago. The problem occurs constantly. The problem has been gradually worsening. Pertinent negatives include no headaches. Nothing aggravates the symptoms. Nothing relieves the symptoms.  pt reports she was hit in left eye by bushes while trimming yesterday She does not wear contacts She has flushed her eye without improvement Past Medical History  Diagnosis Date  . Spinal stenosis   . Seasonal allergies    Past Surgical History  Procedure Laterality Date  . Tubal ligation      early 40s  . Torn achilles      bilaterally in the 90s  . Colonoscopy N/A 08/25/2012    Procedure: COLONOSCOPY;  Surgeon: Malissa Hippo, MD;  Location: [facility];  Service: Endoscopy;  Laterality: N/A;  830   No family history on file. History  Substance Use Topics  . Smoking status: Never Smoker   . Smokeless tobacco: Not on file  . Alcohol Use: Yes     Comment: social   OB History   Grav Para Term Preterm Abortions TAB SAB Ect Mult Living                 Review of Systems  Eyes: Positive for pain and redness.  Neurological: Negative for headaches.    Allergies  Review of patient's allergies indicates no known allergies.  Home Medications   Current Outpatient Rx  Name  Route  Sig  Dispense  Refill  . HYDROcodone-acetaminophen (NORCO/VICODIN) 5-325 MG per tablet   Oral   Take 2 tablets by mouth every 4 (four) hours as needed for pain.   15 tablet   0   . naproxen sodium (ANAPROX) 220 MG tablet   Oral   Take 220 mg by mouth 2 (two) times daily as needed.          BP 176/93  Pulse 78  Temp(Src) 97.6 F (36.4 C) (Oral)  Resp  18  SpO2 97% Physical Exam CONSTITUTIONAL: Well developed/well nourished HEAD: Normocephalic/atraumatic EYES: EOMI/PERRL-OS -conjunctiva erythematous.  No foreign body noted in eye or under eyelids.  2 abrasions noted left inferior portion.  No hyphema/hypopyon. No corneal hazing.    ENMT: Mucous membranes moist NECK: supple no meningeal signs LUNGS: Lungs are clear to auscultation bilaterally, no apparent distress ABDOMEN: soft, nontender, no rebound or guarding NEURO: Pt is awake/alert, moves all extremitiesx4 EXTREMITIES:full ROM SKIN: warm, color normal PSYCH: no abnormalities of mood noted  ED Course  Procedures 1. Corneal abrasion, left, initial encounter     MDM  Nursing notes including past medical history and social history reviewed and considered in documentation  No foreign bodies noted in eye She felt immediate relief with tetracaine Start tobramycin f/u with ophtho if no improvement in symptoms in 48-72 hrs  Joya Gaskins, MD 01/16/13 [phone]

## 2013-01-16 NOTE — ED Notes (Signed)
Patient states she was trimming bushes yesterday afternoon and states she hit her left eye with a piece of bush.  Left eye reddened.

## 2013-01-19 DIAGNOSIS — J309 Allergic rhinitis, unspecified: Secondary | ICD-10-CM | POA: Diagnosis not present

## 2013-02-02 DIAGNOSIS — J309 Allergic rhinitis, unspecified: Secondary | ICD-10-CM | POA: Diagnosis not present

## 2013-02-16 DIAGNOSIS — J309 Allergic rhinitis, unspecified: Secondary | ICD-10-CM | POA: Diagnosis not present

## 2013-02-23 DIAGNOSIS — J309 Allergic rhinitis, unspecified: Secondary | ICD-10-CM | POA: Diagnosis not present

## 2013-03-02 DIAGNOSIS — J309 Allergic rhinitis, unspecified: Secondary | ICD-10-CM | POA: Diagnosis not present

## 2013-03-09 DIAGNOSIS — J309 Allergic rhinitis, unspecified: Secondary | ICD-10-CM | POA: Diagnosis not present

## 2013-03-17 DIAGNOSIS — J309 Allergic rhinitis, unspecified: Secondary | ICD-10-CM | POA: Diagnosis not present

## 2013-04-06 DIAGNOSIS — J309 Allergic rhinitis, unspecified: Secondary | ICD-10-CM | POA: Diagnosis not present

## 2013-04-27 DIAGNOSIS — J309 Allergic rhinitis, unspecified: Secondary | ICD-10-CM | POA: Diagnosis not present

## 2013-04-29 DIAGNOSIS — Z23 Encounter for immunization: Secondary | ICD-10-CM | POA: Diagnosis not present

## 2013-05-09 DIAGNOSIS — M1710 Unilateral primary osteoarthritis, unspecified knee: Secondary | ICD-10-CM | POA: Diagnosis not present

## 2013-05-11 DIAGNOSIS — J309 Allergic rhinitis, unspecified: Secondary | ICD-10-CM | POA: Diagnosis not present

## 2013-05-18 DIAGNOSIS — J309 Allergic rhinitis, unspecified: Secondary | ICD-10-CM | POA: Diagnosis not present

## 2013-06-08 DIAGNOSIS — J309 Allergic rhinitis, unspecified: Secondary | ICD-10-CM | POA: Diagnosis not present

## 2013-06-15 DIAGNOSIS — J309 Allergic rhinitis, unspecified: Secondary | ICD-10-CM | POA: Diagnosis not present

## 2013-06-20 DIAGNOSIS — S83289A Other tear of lateral meniscus, current injury, unspecified knee, initial encounter: Secondary | ICD-10-CM | POA: Diagnosis not present

## 2013-06-27 DIAGNOSIS — M1710 Unilateral primary osteoarthritis, unspecified knee: Secondary | ICD-10-CM | POA: Diagnosis not present

## 2013-06-28 DIAGNOSIS — J309 Allergic rhinitis, unspecified: Secondary | ICD-10-CM | POA: Diagnosis not present

## 2013-07-06 DIAGNOSIS — M1710 Unilateral primary osteoarthritis, unspecified knee: Secondary | ICD-10-CM | POA: Diagnosis not present

## 2013-07-07 DIAGNOSIS — J309 Allergic rhinitis, unspecified: Secondary | ICD-10-CM | POA: Diagnosis not present

## 2013-07-13 DIAGNOSIS — M1710 Unilateral primary osteoarthritis, unspecified knee: Secondary | ICD-10-CM | POA: Diagnosis not present

## 2013-07-17 DIAGNOSIS — J309 Allergic rhinitis, unspecified: Secondary | ICD-10-CM | POA: Diagnosis not present

## 2013-07-19 DIAGNOSIS — M1710 Unilateral primary osteoarthritis, unspecified knee: Secondary | ICD-10-CM | POA: Diagnosis not present

## 2013-08-08 DIAGNOSIS — J309 Allergic rhinitis, unspecified: Secondary | ICD-10-CM | POA: Diagnosis not present

## 2013-08-23 DIAGNOSIS — J309 Allergic rhinitis, unspecified: Secondary | ICD-10-CM | POA: Diagnosis not present

## 2013-08-30 DIAGNOSIS — J301 Allergic rhinitis due to pollen: Secondary | ICD-10-CM | POA: Diagnosis not present

## 2013-08-30 DIAGNOSIS — J3089 Other allergic rhinitis: Secondary | ICD-10-CM | POA: Diagnosis not present

## 2013-08-30 DIAGNOSIS — H1045 Other chronic allergic conjunctivitis: Secondary | ICD-10-CM | POA: Diagnosis not present

## 2013-08-31 DIAGNOSIS — J309 Allergic rhinitis, unspecified: Secondary | ICD-10-CM | POA: Diagnosis not present

## 2013-09-07 DIAGNOSIS — J309 Allergic rhinitis, unspecified: Secondary | ICD-10-CM | POA: Diagnosis not present

## 2013-09-14 DIAGNOSIS — J309 Allergic rhinitis, unspecified: Secondary | ICD-10-CM | POA: Diagnosis not present

## 2013-09-22 DIAGNOSIS — J309 Allergic rhinitis, unspecified: Secondary | ICD-10-CM | POA: Diagnosis not present

## 2013-09-28 DIAGNOSIS — J309 Allergic rhinitis, unspecified: Secondary | ICD-10-CM | POA: Diagnosis not present

## 2013-10-05 DIAGNOSIS — J309 Allergic rhinitis, unspecified: Secondary | ICD-10-CM | POA: Diagnosis not present

## 2013-10-06 DIAGNOSIS — J309 Allergic rhinitis, unspecified: Secondary | ICD-10-CM | POA: Diagnosis not present

## 2013-10-12 DIAGNOSIS — J309 Allergic rhinitis, unspecified: Secondary | ICD-10-CM | POA: Diagnosis not present

## 2013-10-26 DIAGNOSIS — J309 Allergic rhinitis, unspecified: Secondary | ICD-10-CM | POA: Diagnosis not present

## 2013-10-31 DIAGNOSIS — M1710 Unilateral primary osteoarthritis, unspecified knee: Secondary | ICD-10-CM | POA: Diagnosis not present

## 2013-11-02 DIAGNOSIS — J309 Allergic rhinitis, unspecified: Secondary | ICD-10-CM | POA: Diagnosis not present

## 2013-11-09 DIAGNOSIS — J309 Allergic rhinitis, unspecified: Secondary | ICD-10-CM | POA: Diagnosis not present

## 2013-11-17 DIAGNOSIS — J309 Allergic rhinitis, unspecified: Secondary | ICD-10-CM | POA: Diagnosis not present

## 2013-11-23 DIAGNOSIS — J309 Allergic rhinitis, unspecified: Secondary | ICD-10-CM | POA: Diagnosis not present

## 2013-11-30 DIAGNOSIS — J309 Allergic rhinitis, unspecified: Secondary | ICD-10-CM | POA: Diagnosis not present

## 2013-12-05 DIAGNOSIS — J309 Allergic rhinitis, unspecified: Secondary | ICD-10-CM | POA: Diagnosis not present

## 2013-12-14 DIAGNOSIS — J309 Allergic rhinitis, unspecified: Secondary | ICD-10-CM | POA: Diagnosis not present

## 2013-12-19 DIAGNOSIS — J309 Allergic rhinitis, unspecified: Secondary | ICD-10-CM | POA: Diagnosis not present

## 2013-12-28 DIAGNOSIS — J309 Allergic rhinitis, unspecified: Secondary | ICD-10-CM | POA: Diagnosis not present

## 2014-01-04 DIAGNOSIS — J309 Allergic rhinitis, unspecified: Secondary | ICD-10-CM | POA: Diagnosis not present

## 2014-01-11 DIAGNOSIS — J309 Allergic rhinitis, unspecified: Secondary | ICD-10-CM | POA: Diagnosis not present

## 2014-01-26 DIAGNOSIS — J309 Allergic rhinitis, unspecified: Secondary | ICD-10-CM | POA: Diagnosis not present

## 2014-02-05 DIAGNOSIS — J309 Allergic rhinitis, unspecified: Secondary | ICD-10-CM | POA: Diagnosis not present

## 2014-02-15 DIAGNOSIS — J309 Allergic rhinitis, unspecified: Secondary | ICD-10-CM | POA: Diagnosis not present

## 2014-02-22 DIAGNOSIS — J309 Allergic rhinitis, unspecified: Secondary | ICD-10-CM | POA: Diagnosis not present

## 2014-02-27 ENCOUNTER — Other Ambulatory Visit (HOSPITAL_COMMUNITY): Payer: Self-pay | Admitting: Internal Medicine

## 2014-02-27 DIAGNOSIS — Z23 Encounter for immunization: Secondary | ICD-10-CM | POA: Diagnosis not present

## 2014-02-27 DIAGNOSIS — I10 Essential (primary) hypertension: Secondary | ICD-10-CM | POA: Diagnosis not present

## 2014-02-27 DIAGNOSIS — Z1231 Encounter for screening mammogram for malignant neoplasm of breast: Secondary | ICD-10-CM

## 2014-02-27 DIAGNOSIS — G8929 Other chronic pain: Secondary | ICD-10-CM | POA: Diagnosis not present

## 2014-02-27 DIAGNOSIS — Z6833 Body mass index (BMI) 33.0-33.9, adult: Secondary | ICD-10-CM | POA: Diagnosis not present

## 2014-03-01 DIAGNOSIS — J309 Allergic rhinitis, unspecified: Secondary | ICD-10-CM | POA: Diagnosis not present

## 2014-03-02 ENCOUNTER — Ambulatory Visit (HOSPITAL_COMMUNITY)
Admission: RE | Admit: 2014-03-02 | Discharge: 2014-03-02 | Disposition: A | Discharge status: Home / Self Care | Payer: Medicare Other | Source: Ambulatory Visit | Attending: Internal Medicine | Admitting: Internal Medicine

## 2014-03-02 DIAGNOSIS — Z1231 Encounter for screening mammogram for malignant neoplasm of breast: Secondary | ICD-10-CM

## 2014-03-08 DIAGNOSIS — J309 Allergic rhinitis, unspecified: Secondary | ICD-10-CM | POA: Diagnosis not present

## 2014-03-29 DIAGNOSIS — J3089 Other allergic rhinitis: Secondary | ICD-10-CM | POA: Diagnosis not present

## 2014-03-29 DIAGNOSIS — J301 Allergic rhinitis due to pollen: Secondary | ICD-10-CM | POA: Diagnosis not present

## 2014-04-06 DIAGNOSIS — J0100 Acute maxillary sinusitis, unspecified: Secondary | ICD-10-CM | POA: Diagnosis not present

## 2014-04-09 DIAGNOSIS — J301 Allergic rhinitis due to pollen: Secondary | ICD-10-CM | POA: Diagnosis not present

## 2014-04-13 DIAGNOSIS — J301 Allergic rhinitis due to pollen: Secondary | ICD-10-CM | POA: Diagnosis not present

## 2014-04-13 DIAGNOSIS — J3089 Other allergic rhinitis: Secondary | ICD-10-CM | POA: Diagnosis not present

## 2014-04-13 DIAGNOSIS — J3081 Allergic rhinitis due to animal (cat) (dog) hair and dander: Secondary | ICD-10-CM | POA: Diagnosis not present

## 2014-04-18 DIAGNOSIS — Z23 Encounter for immunization: Secondary | ICD-10-CM | POA: Diagnosis not present

## 2014-04-19 DIAGNOSIS — J3081 Allergic rhinitis due to animal (cat) (dog) hair and dander: Secondary | ICD-10-CM | POA: Diagnosis not present

## 2014-04-19 DIAGNOSIS — J301 Allergic rhinitis due to pollen: Secondary | ICD-10-CM | POA: Diagnosis not present

## 2014-04-19 DIAGNOSIS — J3089 Other allergic rhinitis: Secondary | ICD-10-CM | POA: Diagnosis not present

## 2014-05-04 DIAGNOSIS — J3089 Other allergic rhinitis: Secondary | ICD-10-CM | POA: Diagnosis not present

## 2014-05-04 DIAGNOSIS — J3081 Allergic rhinitis due to animal (cat) (dog) hair and dander: Secondary | ICD-10-CM | POA: Diagnosis not present

## 2014-05-04 DIAGNOSIS — J301 Allergic rhinitis due to pollen: Secondary | ICD-10-CM | POA: Diagnosis not present

## 2014-05-11 DIAGNOSIS — J301 Allergic rhinitis due to pollen: Secondary | ICD-10-CM | POA: Diagnosis not present

## 2014-05-11 DIAGNOSIS — J3089 Other allergic rhinitis: Secondary | ICD-10-CM | POA: Diagnosis not present

## 2014-05-11 DIAGNOSIS — J3081 Allergic rhinitis due to animal (cat) (dog) hair and dander: Secondary | ICD-10-CM | POA: Diagnosis not present

## 2014-05-17 DIAGNOSIS — J301 Allergic rhinitis due to pollen: Secondary | ICD-10-CM | POA: Diagnosis not present

## 2014-05-17 DIAGNOSIS — J3081 Allergic rhinitis due to animal (cat) (dog) hair and dander: Secondary | ICD-10-CM | POA: Diagnosis not present

## 2014-05-17 DIAGNOSIS — J3089 Other allergic rhinitis: Secondary | ICD-10-CM | POA: Diagnosis not present

## 2014-05-23 DIAGNOSIS — J3089 Other allergic rhinitis: Secondary | ICD-10-CM | POA: Diagnosis not present

## 2014-05-23 DIAGNOSIS — J3081 Allergic rhinitis due to animal (cat) (dog) hair and dander: Secondary | ICD-10-CM | POA: Diagnosis not present

## 2014-06-11 DIAGNOSIS — J3081 Allergic rhinitis due to animal (cat) (dog) hair and dander: Secondary | ICD-10-CM | POA: Diagnosis not present

## 2014-06-11 DIAGNOSIS — J3089 Other allergic rhinitis: Secondary | ICD-10-CM | POA: Diagnosis not present

## 2014-06-11 DIAGNOSIS — J301 Allergic rhinitis due to pollen: Secondary | ICD-10-CM | POA: Diagnosis not present

## 2014-06-19 DIAGNOSIS — J301 Allergic rhinitis due to pollen: Secondary | ICD-10-CM | POA: Diagnosis not present

## 2014-06-19 DIAGNOSIS — J3081 Allergic rhinitis due to animal (cat) (dog) hair and dander: Secondary | ICD-10-CM | POA: Diagnosis not present

## 2014-06-19 DIAGNOSIS — J3089 Other allergic rhinitis: Secondary | ICD-10-CM | POA: Diagnosis not present

## 2014-06-27 DIAGNOSIS — J3089 Other allergic rhinitis: Secondary | ICD-10-CM | POA: Diagnosis not present

## 2014-06-27 DIAGNOSIS — J3081 Allergic rhinitis due to animal (cat) (dog) hair and dander: Secondary | ICD-10-CM | POA: Diagnosis not present

## 2014-06-27 DIAGNOSIS — J301 Allergic rhinitis due to pollen: Secondary | ICD-10-CM | POA: Diagnosis not present

## 2014-07-05 DIAGNOSIS — J3081 Allergic rhinitis due to animal (cat) (dog) hair and dander: Secondary | ICD-10-CM | POA: Diagnosis not present

## 2014-07-05 DIAGNOSIS — J301 Allergic rhinitis due to pollen: Secondary | ICD-10-CM | POA: Diagnosis not present

## 2014-07-05 DIAGNOSIS — J3089 Other allergic rhinitis: Secondary | ICD-10-CM | POA: Diagnosis not present

## 2014-07-16 DIAGNOSIS — J0100 Acute maxillary sinusitis, unspecified: Secondary | ICD-10-CM | POA: Diagnosis not present

## 2014-07-16 DIAGNOSIS — I10 Essential (primary) hypertension: Secondary | ICD-10-CM | POA: Diagnosis not present

## 2014-07-24 DIAGNOSIS — J301 Allergic rhinitis due to pollen: Secondary | ICD-10-CM | POA: Diagnosis not present

## 2014-07-24 DIAGNOSIS — J3089 Other allergic rhinitis: Secondary | ICD-10-CM | POA: Diagnosis not present

## 2014-07-24 DIAGNOSIS — J3081 Allergic rhinitis due to animal (cat) (dog) hair and dander: Secondary | ICD-10-CM | POA: Diagnosis not present

## 2014-08-09 DIAGNOSIS — J301 Allergic rhinitis due to pollen: Secondary | ICD-10-CM | POA: Diagnosis not present

## 2014-08-09 DIAGNOSIS — J3081 Allergic rhinitis due to animal (cat) (dog) hair and dander: Secondary | ICD-10-CM | POA: Diagnosis not present

## 2014-08-09 DIAGNOSIS — J3089 Other allergic rhinitis: Secondary | ICD-10-CM | POA: Diagnosis not present

## 2014-08-16 DIAGNOSIS — J301 Allergic rhinitis due to pollen: Secondary | ICD-10-CM | POA: Diagnosis not present

## 2014-08-16 DIAGNOSIS — J3089 Other allergic rhinitis: Secondary | ICD-10-CM | POA: Diagnosis not present

## 2014-08-16 DIAGNOSIS — J3081 Allergic rhinitis due to animal (cat) (dog) hair and dander: Secondary | ICD-10-CM | POA: Diagnosis not present

## 2014-08-24 DIAGNOSIS — J301 Allergic rhinitis due to pollen: Secondary | ICD-10-CM | POA: Diagnosis not present

## 2014-08-24 DIAGNOSIS — J3081 Allergic rhinitis due to animal (cat) (dog) hair and dander: Secondary | ICD-10-CM | POA: Diagnosis not present

## 2014-08-24 DIAGNOSIS — J3089 Other allergic rhinitis: Secondary | ICD-10-CM | POA: Diagnosis not present

## 2014-08-30 DIAGNOSIS — J3081 Allergic rhinitis due to animal (cat) (dog) hair and dander: Secondary | ICD-10-CM | POA: Diagnosis not present

## 2014-08-30 DIAGNOSIS — J301 Allergic rhinitis due to pollen: Secondary | ICD-10-CM | POA: Diagnosis not present

## 2014-08-30 DIAGNOSIS — J3089 Other allergic rhinitis: Secondary | ICD-10-CM | POA: Diagnosis not present

## 2014-09-06 DIAGNOSIS — J3081 Allergic rhinitis due to animal (cat) (dog) hair and dander: Secondary | ICD-10-CM | POA: Diagnosis not present

## 2014-09-06 DIAGNOSIS — J301 Allergic rhinitis due to pollen: Secondary | ICD-10-CM | POA: Diagnosis not present

## 2014-09-06 DIAGNOSIS — J3089 Other allergic rhinitis: Secondary | ICD-10-CM | POA: Diagnosis not present

## 2014-09-12 DIAGNOSIS — J301 Allergic rhinitis due to pollen: Secondary | ICD-10-CM | POA: Diagnosis not present

## 2014-09-12 DIAGNOSIS — H1045 Other chronic allergic conjunctivitis: Secondary | ICD-10-CM | POA: Diagnosis not present

## 2014-09-12 DIAGNOSIS — J3089 Other allergic rhinitis: Secondary | ICD-10-CM | POA: Diagnosis not present

## 2014-09-14 DIAGNOSIS — J301 Allergic rhinitis due to pollen: Secondary | ICD-10-CM | POA: Diagnosis not present

## 2014-09-14 DIAGNOSIS — J3089 Other allergic rhinitis: Secondary | ICD-10-CM | POA: Diagnosis not present

## 2014-09-14 DIAGNOSIS — J3081 Allergic rhinitis due to animal (cat) (dog) hair and dander: Secondary | ICD-10-CM | POA: Diagnosis not present

## 2014-09-20 DIAGNOSIS — J3089 Other allergic rhinitis: Secondary | ICD-10-CM | POA: Diagnosis not present

## 2014-09-20 DIAGNOSIS — J301 Allergic rhinitis due to pollen: Secondary | ICD-10-CM | POA: Diagnosis not present

## 2014-09-20 DIAGNOSIS — J3081 Allergic rhinitis due to animal (cat) (dog) hair and dander: Secondary | ICD-10-CM | POA: Diagnosis not present

## 2014-09-27 DIAGNOSIS — J3089 Other allergic rhinitis: Secondary | ICD-10-CM | POA: Diagnosis not present

## 2014-09-27 DIAGNOSIS — J301 Allergic rhinitis due to pollen: Secondary | ICD-10-CM | POA: Diagnosis not present

## 2014-09-27 DIAGNOSIS — J3081 Allergic rhinitis due to animal (cat) (dog) hair and dander: Secondary | ICD-10-CM | POA: Diagnosis not present

## 2014-10-04 DIAGNOSIS — J301 Allergic rhinitis due to pollen: Secondary | ICD-10-CM | POA: Diagnosis not present

## 2014-10-04 DIAGNOSIS — J3089 Other allergic rhinitis: Secondary | ICD-10-CM | POA: Diagnosis not present

## 2014-10-04 DIAGNOSIS — J3081 Allergic rhinitis due to animal (cat) (dog) hair and dander: Secondary | ICD-10-CM | POA: Diagnosis not present

## 2014-10-18 DIAGNOSIS — J3089 Other allergic rhinitis: Secondary | ICD-10-CM | POA: Diagnosis not present

## 2014-10-18 DIAGNOSIS — J301 Allergic rhinitis due to pollen: Secondary | ICD-10-CM | POA: Diagnosis not present

## 2014-11-08 DIAGNOSIS — J301 Allergic rhinitis due to pollen: Secondary | ICD-10-CM | POA: Diagnosis not present

## 2014-11-08 DIAGNOSIS — J3089 Other allergic rhinitis: Secondary | ICD-10-CM | POA: Diagnosis not present

## 2014-11-22 DIAGNOSIS — J301 Allergic rhinitis due to pollen: Secondary | ICD-10-CM | POA: Diagnosis not present

## 2014-11-22 DIAGNOSIS — J3081 Allergic rhinitis due to animal (cat) (dog) hair and dander: Secondary | ICD-10-CM | POA: Diagnosis not present

## 2014-11-22 DIAGNOSIS — J3089 Other allergic rhinitis: Secondary | ICD-10-CM | POA: Diagnosis not present

## 2014-12-10 DIAGNOSIS — J3089 Other allergic rhinitis: Secondary | ICD-10-CM | POA: Diagnosis not present

## 2014-12-10 DIAGNOSIS — J301 Allergic rhinitis due to pollen: Secondary | ICD-10-CM | POA: Diagnosis not present

## 2014-12-20 DIAGNOSIS — J3081 Allergic rhinitis due to animal (cat) (dog) hair and dander: Secondary | ICD-10-CM | POA: Diagnosis not present

## 2014-12-20 DIAGNOSIS — J301 Allergic rhinitis due to pollen: Secondary | ICD-10-CM | POA: Diagnosis not present

## 2014-12-20 DIAGNOSIS — J3089 Other allergic rhinitis: Secondary | ICD-10-CM | POA: Diagnosis not present

## 2014-12-28 DIAGNOSIS — J3089 Other allergic rhinitis: Secondary | ICD-10-CM | POA: Diagnosis not present

## 2014-12-28 DIAGNOSIS — J301 Allergic rhinitis due to pollen: Secondary | ICD-10-CM | POA: Diagnosis not present

## 2014-12-28 DIAGNOSIS — J3081 Allergic rhinitis due to animal (cat) (dog) hair and dander: Secondary | ICD-10-CM | POA: Diagnosis not present

## 2015-01-03 DIAGNOSIS — J301 Allergic rhinitis due to pollen: Secondary | ICD-10-CM | POA: Diagnosis not present

## 2015-01-03 DIAGNOSIS — J3089 Other allergic rhinitis: Secondary | ICD-10-CM | POA: Diagnosis not present

## 2015-01-08 DIAGNOSIS — J301 Allergic rhinitis due to pollen: Secondary | ICD-10-CM | POA: Diagnosis not present

## 2015-01-08 DIAGNOSIS — J3089 Other allergic rhinitis: Secondary | ICD-10-CM | POA: Diagnosis not present

## 2015-01-17 DIAGNOSIS — J3089 Other allergic rhinitis: Secondary | ICD-10-CM | POA: Diagnosis not present

## 2015-01-17 DIAGNOSIS — J301 Allergic rhinitis due to pollen: Secondary | ICD-10-CM | POA: Diagnosis not present

## 2015-01-24 DIAGNOSIS — J3089 Other allergic rhinitis: Secondary | ICD-10-CM | POA: Diagnosis not present

## 2015-01-24 DIAGNOSIS — J301 Allergic rhinitis due to pollen: Secondary | ICD-10-CM | POA: Diagnosis not present

## 2015-02-07 DIAGNOSIS — J3081 Allergic rhinitis due to animal (cat) (dog) hair and dander: Secondary | ICD-10-CM | POA: Diagnosis not present

## 2015-02-07 DIAGNOSIS — J3089 Other allergic rhinitis: Secondary | ICD-10-CM | POA: Diagnosis not present

## 2015-02-07 DIAGNOSIS — J301 Allergic rhinitis due to pollen: Secondary | ICD-10-CM | POA: Diagnosis not present

## 2015-02-11 DIAGNOSIS — J3089 Other allergic rhinitis: Secondary | ICD-10-CM | POA: Diagnosis not present

## 2015-02-11 DIAGNOSIS — J301 Allergic rhinitis due to pollen: Secondary | ICD-10-CM | POA: Diagnosis not present

## 2015-02-22 DIAGNOSIS — J3089 Other allergic rhinitis: Secondary | ICD-10-CM | POA: Diagnosis not present

## 2015-02-22 DIAGNOSIS — J301 Allergic rhinitis due to pollen: Secondary | ICD-10-CM | POA: Diagnosis not present

## 2015-02-28 DIAGNOSIS — J301 Allergic rhinitis due to pollen: Secondary | ICD-10-CM | POA: Diagnosis not present

## 2015-02-28 DIAGNOSIS — J3089 Other allergic rhinitis: Secondary | ICD-10-CM | POA: Diagnosis not present

## 2015-02-28 DIAGNOSIS — J3081 Allergic rhinitis due to animal (cat) (dog) hair and dander: Secondary | ICD-10-CM | POA: Diagnosis not present

## 2015-03-07 DIAGNOSIS — J301 Allergic rhinitis due to pollen: Secondary | ICD-10-CM | POA: Diagnosis not present

## 2015-03-07 DIAGNOSIS — J3081 Allergic rhinitis due to animal (cat) (dog) hair and dander: Secondary | ICD-10-CM | POA: Diagnosis not present

## 2015-03-07 DIAGNOSIS — J3089 Other allergic rhinitis: Secondary | ICD-10-CM | POA: Diagnosis not present

## 2015-03-11 DIAGNOSIS — J3089 Other allergic rhinitis: Secondary | ICD-10-CM | POA: Diagnosis not present

## 2015-03-11 DIAGNOSIS — J301 Allergic rhinitis due to pollen: Secondary | ICD-10-CM | POA: Diagnosis not present

## 2015-03-11 DIAGNOSIS — J3081 Allergic rhinitis due to animal (cat) (dog) hair and dander: Secondary | ICD-10-CM | POA: Diagnosis not present

## 2015-03-21 DIAGNOSIS — J301 Allergic rhinitis due to pollen: Secondary | ICD-10-CM | POA: Diagnosis not present

## 2015-03-21 DIAGNOSIS — J3081 Allergic rhinitis due to animal (cat) (dog) hair and dander: Secondary | ICD-10-CM | POA: Diagnosis not present

## 2015-03-21 DIAGNOSIS — J3089 Other allergic rhinitis: Secondary | ICD-10-CM | POA: Diagnosis not present

## 2015-03-26 DIAGNOSIS — Z23 Encounter for immunization: Secondary | ICD-10-CM | POA: Diagnosis not present

## 2015-03-28 DIAGNOSIS — J3089 Other allergic rhinitis: Secondary | ICD-10-CM | POA: Diagnosis not present

## 2015-03-28 DIAGNOSIS — J301 Allergic rhinitis due to pollen: Secondary | ICD-10-CM | POA: Diagnosis not present

## 2015-03-28 DIAGNOSIS — J3081 Allergic rhinitis due to animal (cat) (dog) hair and dander: Secondary | ICD-10-CM | POA: Diagnosis not present

## 2015-04-01 DIAGNOSIS — J3089 Other allergic rhinitis: Secondary | ICD-10-CM | POA: Diagnosis not present

## 2015-04-01 DIAGNOSIS — J301 Allergic rhinitis due to pollen: Secondary | ICD-10-CM | POA: Diagnosis not present

## 2015-04-01 DIAGNOSIS — J3081 Allergic rhinitis due to animal (cat) (dog) hair and dander: Secondary | ICD-10-CM | POA: Diagnosis not present

## 2015-04-10 DIAGNOSIS — J301 Allergic rhinitis due to pollen: Secondary | ICD-10-CM | POA: Diagnosis not present

## 2015-04-10 DIAGNOSIS — H1045 Other chronic allergic conjunctivitis: Secondary | ICD-10-CM | POA: Diagnosis not present

## 2015-04-10 DIAGNOSIS — J3089 Other allergic rhinitis: Secondary | ICD-10-CM | POA: Diagnosis not present

## 2015-04-15 ENCOUNTER — Other Ambulatory Visit (HOSPITAL_COMMUNITY): Payer: Self-pay | Admitting: Internal Medicine

## 2015-04-15 DIAGNOSIS — Z1231 Encounter for screening mammogram for malignant neoplasm of breast: Secondary | ICD-10-CM

## 2015-04-16 DIAGNOSIS — E039 Hypothyroidism, unspecified: Secondary | ICD-10-CM | POA: Diagnosis not present

## 2015-04-16 DIAGNOSIS — R739 Hyperglycemia, unspecified: Secondary | ICD-10-CM | POA: Diagnosis not present

## 2015-04-16 DIAGNOSIS — R5383 Other fatigue: Secondary | ICD-10-CM | POA: Diagnosis not present

## 2015-04-16 DIAGNOSIS — G894 Chronic pain syndrome: Secondary | ICD-10-CM | POA: Diagnosis not present

## 2015-04-16 DIAGNOSIS — Z1389 Encounter for screening for other disorder: Secondary | ICD-10-CM | POA: Diagnosis not present

## 2015-04-16 DIAGNOSIS — Z0000 Encounter for general adult medical examination without abnormal findings: Secondary | ICD-10-CM | POA: Diagnosis not present

## 2015-04-16 DIAGNOSIS — Z6833 Body mass index (BMI) 33.0-33.9, adult: Secondary | ICD-10-CM | POA: Diagnosis not present

## 2015-04-16 DIAGNOSIS — E6609 Other obesity due to excess calories: Secondary | ICD-10-CM | POA: Diagnosis not present

## 2015-04-17 ENCOUNTER — Ambulatory Visit (HOSPITAL_COMMUNITY): Payer: Medicare Other

## 2015-04-18 ENCOUNTER — Ambulatory Visit (HOSPITAL_COMMUNITY)
Admission: RE | Admit: 2015-04-18 | Discharge: 2015-04-18 | Disposition: A | Discharge status: Home / Self Care | Payer: Medicare Other | Source: Ambulatory Visit | Attending: Internal Medicine | Admitting: Internal Medicine

## 2015-04-18 DIAGNOSIS — Z1231 Encounter for screening mammogram for malignant neoplasm of breast: Secondary | ICD-10-CM

## 2015-04-19 DIAGNOSIS — J3081 Allergic rhinitis due to animal (cat) (dog) hair and dander: Secondary | ICD-10-CM | POA: Diagnosis not present

## 2015-04-19 DIAGNOSIS — J301 Allergic rhinitis due to pollen: Secondary | ICD-10-CM | POA: Diagnosis not present

## 2015-04-19 DIAGNOSIS — J3089 Other allergic rhinitis: Secondary | ICD-10-CM | POA: Diagnosis not present

## 2015-04-25 DIAGNOSIS — J3081 Allergic rhinitis due to animal (cat) (dog) hair and dander: Secondary | ICD-10-CM | POA: Diagnosis not present

## 2015-04-25 DIAGNOSIS — J301 Allergic rhinitis due to pollen: Secondary | ICD-10-CM | POA: Diagnosis not present

## 2015-04-25 DIAGNOSIS — J3089 Other allergic rhinitis: Secondary | ICD-10-CM | POA: Diagnosis not present

## 2015-05-02 DIAGNOSIS — J3081 Allergic rhinitis due to animal (cat) (dog) hair and dander: Secondary | ICD-10-CM | POA: Diagnosis not present

## 2015-05-02 DIAGNOSIS — J3089 Other allergic rhinitis: Secondary | ICD-10-CM | POA: Diagnosis not present

## 2015-05-02 DIAGNOSIS — J301 Allergic rhinitis due to pollen: Secondary | ICD-10-CM | POA: Diagnosis not present

## 2015-05-17 DIAGNOSIS — J3089 Other allergic rhinitis: Secondary | ICD-10-CM | POA: Diagnosis not present

## 2015-05-17 DIAGNOSIS — J301 Allergic rhinitis due to pollen: Secondary | ICD-10-CM | POA: Diagnosis not present

## 2015-05-17 DIAGNOSIS — J3081 Allergic rhinitis due to animal (cat) (dog) hair and dander: Secondary | ICD-10-CM | POA: Diagnosis not present

## 2015-05-21 DIAGNOSIS — J301 Allergic rhinitis due to pollen: Secondary | ICD-10-CM | POA: Diagnosis not present

## 2015-05-21 DIAGNOSIS — J3089 Other allergic rhinitis: Secondary | ICD-10-CM | POA: Diagnosis not present

## 2015-05-21 DIAGNOSIS — J3081 Allergic rhinitis due to animal (cat) (dog) hair and dander: Secondary | ICD-10-CM | POA: Diagnosis not present

## 2015-05-30 DIAGNOSIS — J3081 Allergic rhinitis due to animal (cat) (dog) hair and dander: Secondary | ICD-10-CM | POA: Diagnosis not present

## 2015-05-30 DIAGNOSIS — J3089 Other allergic rhinitis: Secondary | ICD-10-CM | POA: Diagnosis not present

## 2015-05-30 DIAGNOSIS — J301 Allergic rhinitis due to pollen: Secondary | ICD-10-CM | POA: Diagnosis not present

## 2015-06-06 DIAGNOSIS — J3081 Allergic rhinitis due to animal (cat) (dog) hair and dander: Secondary | ICD-10-CM | POA: Diagnosis not present

## 2015-06-06 DIAGNOSIS — J301 Allergic rhinitis due to pollen: Secondary | ICD-10-CM | POA: Diagnosis not present

## 2015-06-06 DIAGNOSIS — J3089 Other allergic rhinitis: Secondary | ICD-10-CM | POA: Diagnosis not present

## 2015-06-13 DIAGNOSIS — J3089 Other allergic rhinitis: Secondary | ICD-10-CM | POA: Diagnosis not present

## 2015-06-13 DIAGNOSIS — J301 Allergic rhinitis due to pollen: Secondary | ICD-10-CM | POA: Diagnosis not present

## 2015-06-13 DIAGNOSIS — J3081 Allergic rhinitis due to animal (cat) (dog) hair and dander: Secondary | ICD-10-CM | POA: Diagnosis not present

## 2015-06-19 DIAGNOSIS — J3081 Allergic rhinitis due to animal (cat) (dog) hair and dander: Secondary | ICD-10-CM | POA: Diagnosis not present

## 2015-06-19 DIAGNOSIS — J3089 Other allergic rhinitis: Secondary | ICD-10-CM | POA: Diagnosis not present

## 2015-06-19 DIAGNOSIS — J301 Allergic rhinitis due to pollen: Secondary | ICD-10-CM | POA: Diagnosis not present

## 2015-07-04 DIAGNOSIS — J3089 Other allergic rhinitis: Secondary | ICD-10-CM | POA: Diagnosis not present

## 2015-07-04 DIAGNOSIS — J301 Allergic rhinitis due to pollen: Secondary | ICD-10-CM | POA: Diagnosis not present

## 2015-07-04 DIAGNOSIS — J3081 Allergic rhinitis due to animal (cat) (dog) hair and dander: Secondary | ICD-10-CM | POA: Diagnosis not present

## 2015-07-11 DIAGNOSIS — J301 Allergic rhinitis due to pollen: Secondary | ICD-10-CM | POA: Diagnosis not present

## 2015-07-11 DIAGNOSIS — J3089 Other allergic rhinitis: Secondary | ICD-10-CM | POA: Diagnosis not present

## 2015-07-11 DIAGNOSIS — J3081 Allergic rhinitis due to animal (cat) (dog) hair and dander: Secondary | ICD-10-CM | POA: Diagnosis not present

## 2015-07-29 DIAGNOSIS — J3081 Allergic rhinitis due to animal (cat) (dog) hair and dander: Secondary | ICD-10-CM | POA: Diagnosis not present

## 2015-07-29 DIAGNOSIS — J301 Allergic rhinitis due to pollen: Secondary | ICD-10-CM | POA: Diagnosis not present

## 2015-07-29 DIAGNOSIS — J3089 Other allergic rhinitis: Secondary | ICD-10-CM | POA: Diagnosis not present

## 2015-08-08 DIAGNOSIS — J3081 Allergic rhinitis due to animal (cat) (dog) hair and dander: Secondary | ICD-10-CM | POA: Diagnosis not present

## 2015-08-08 DIAGNOSIS — J301 Allergic rhinitis due to pollen: Secondary | ICD-10-CM | POA: Diagnosis not present

## 2015-08-08 DIAGNOSIS — J3089 Other allergic rhinitis: Secondary | ICD-10-CM | POA: Diagnosis not present

## 2015-08-16 DIAGNOSIS — J3081 Allergic rhinitis due to animal (cat) (dog) hair and dander: Secondary | ICD-10-CM | POA: Diagnosis not present

## 2015-08-16 DIAGNOSIS — J3089 Other allergic rhinitis: Secondary | ICD-10-CM | POA: Diagnosis not present

## 2015-08-16 DIAGNOSIS — J301 Allergic rhinitis due to pollen: Secondary | ICD-10-CM | POA: Diagnosis not present

## 2015-08-30 DIAGNOSIS — J301 Allergic rhinitis due to pollen: Secondary | ICD-10-CM | POA: Diagnosis not present

## 2015-08-30 DIAGNOSIS — J3089 Other allergic rhinitis: Secondary | ICD-10-CM | POA: Diagnosis not present

## 2015-08-30 DIAGNOSIS — J3081 Allergic rhinitis due to animal (cat) (dog) hair and dander: Secondary | ICD-10-CM | POA: Diagnosis not present

## 2015-09-05 DIAGNOSIS — J301 Allergic rhinitis due to pollen: Secondary | ICD-10-CM | POA: Diagnosis not present

## 2015-09-05 DIAGNOSIS — J3089 Other allergic rhinitis: Secondary | ICD-10-CM | POA: Diagnosis not present

## 2015-09-05 DIAGNOSIS — J3081 Allergic rhinitis due to animal (cat) (dog) hair and dander: Secondary | ICD-10-CM | POA: Diagnosis not present

## 2015-09-09 DIAGNOSIS — J3081 Allergic rhinitis due to animal (cat) (dog) hair and dander: Secondary | ICD-10-CM | POA: Diagnosis not present

## 2015-09-09 DIAGNOSIS — J3089 Other allergic rhinitis: Secondary | ICD-10-CM | POA: Diagnosis not present

## 2015-09-12 DIAGNOSIS — J3081 Allergic rhinitis due to animal (cat) (dog) hair and dander: Secondary | ICD-10-CM | POA: Diagnosis not present

## 2015-09-12 DIAGNOSIS — J3089 Other allergic rhinitis: Secondary | ICD-10-CM | POA: Diagnosis not present

## 2015-09-12 DIAGNOSIS — J301 Allergic rhinitis due to pollen: Secondary | ICD-10-CM | POA: Diagnosis not present

## 2015-09-19 DIAGNOSIS — J3081 Allergic rhinitis due to animal (cat) (dog) hair and dander: Secondary | ICD-10-CM | POA: Diagnosis not present

## 2015-09-19 DIAGNOSIS — J3089 Other allergic rhinitis: Secondary | ICD-10-CM | POA: Diagnosis not present

## 2015-09-19 DIAGNOSIS — J301 Allergic rhinitis due to pollen: Secondary | ICD-10-CM | POA: Diagnosis not present

## 2015-09-23 DIAGNOSIS — J3089 Other allergic rhinitis: Secondary | ICD-10-CM | POA: Diagnosis not present

## 2015-09-23 DIAGNOSIS — J3081 Allergic rhinitis due to animal (cat) (dog) hair and dander: Secondary | ICD-10-CM | POA: Diagnosis not present

## 2015-09-23 DIAGNOSIS — J301 Allergic rhinitis due to pollen: Secondary | ICD-10-CM | POA: Diagnosis not present

## 2015-09-30 DIAGNOSIS — J301 Allergic rhinitis due to pollen: Secondary | ICD-10-CM | POA: Diagnosis not present

## 2015-10-03 DIAGNOSIS — J3089 Other allergic rhinitis: Secondary | ICD-10-CM | POA: Diagnosis not present

## 2015-10-03 DIAGNOSIS — J3081 Allergic rhinitis due to animal (cat) (dog) hair and dander: Secondary | ICD-10-CM | POA: Diagnosis not present

## 2015-10-03 DIAGNOSIS — J301 Allergic rhinitis due to pollen: Secondary | ICD-10-CM | POA: Diagnosis not present

## 2015-10-17 DIAGNOSIS — J301 Allergic rhinitis due to pollen: Secondary | ICD-10-CM | POA: Diagnosis not present

## 2015-10-17 DIAGNOSIS — J3089 Other allergic rhinitis: Secondary | ICD-10-CM | POA: Diagnosis not present

## 2015-10-17 DIAGNOSIS — J3081 Allergic rhinitis due to animal (cat) (dog) hair and dander: Secondary | ICD-10-CM | POA: Diagnosis not present

## 2015-10-29 DIAGNOSIS — J301 Allergic rhinitis due to pollen: Secondary | ICD-10-CM | POA: Diagnosis not present

## 2015-10-29 DIAGNOSIS — J3089 Other allergic rhinitis: Secondary | ICD-10-CM | POA: Diagnosis not present

## 2015-10-29 DIAGNOSIS — J3081 Allergic rhinitis due to animal (cat) (dog) hair and dander: Secondary | ICD-10-CM | POA: Diagnosis not present

## 2015-11-08 DIAGNOSIS — J3081 Allergic rhinitis due to animal (cat) (dog) hair and dander: Secondary | ICD-10-CM | POA: Diagnosis not present

## 2015-11-08 DIAGNOSIS — J301 Allergic rhinitis due to pollen: Secondary | ICD-10-CM | POA: Diagnosis not present

## 2015-11-08 DIAGNOSIS — J3089 Other allergic rhinitis: Secondary | ICD-10-CM | POA: Diagnosis not present

## 2015-11-14 DIAGNOSIS — J3089 Other allergic rhinitis: Secondary | ICD-10-CM | POA: Diagnosis not present

## 2015-11-14 DIAGNOSIS — J301 Allergic rhinitis due to pollen: Secondary | ICD-10-CM | POA: Diagnosis not present

## 2015-11-14 DIAGNOSIS — J3081 Allergic rhinitis due to animal (cat) (dog) hair and dander: Secondary | ICD-10-CM | POA: Diagnosis not present

## 2015-11-21 DIAGNOSIS — J301 Allergic rhinitis due to pollen: Secondary | ICD-10-CM | POA: Diagnosis not present

## 2015-11-21 DIAGNOSIS — J3089 Other allergic rhinitis: Secondary | ICD-10-CM | POA: Diagnosis not present

## 2015-11-21 DIAGNOSIS — J3081 Allergic rhinitis due to animal (cat) (dog) hair and dander: Secondary | ICD-10-CM | POA: Diagnosis not present

## 2015-11-28 DIAGNOSIS — J3089 Other allergic rhinitis: Secondary | ICD-10-CM | POA: Diagnosis not present

## 2015-11-28 DIAGNOSIS — J3081 Allergic rhinitis due to animal (cat) (dog) hair and dander: Secondary | ICD-10-CM | POA: Diagnosis not present

## 2015-11-28 DIAGNOSIS — J301 Allergic rhinitis due to pollen: Secondary | ICD-10-CM | POA: Diagnosis not present

## 2015-12-05 DIAGNOSIS — J3089 Other allergic rhinitis: Secondary | ICD-10-CM | POA: Diagnosis not present

## 2015-12-05 DIAGNOSIS — J301 Allergic rhinitis due to pollen: Secondary | ICD-10-CM | POA: Diagnosis not present

## 2015-12-05 DIAGNOSIS — J3081 Allergic rhinitis due to animal (cat) (dog) hair and dander: Secondary | ICD-10-CM | POA: Diagnosis not present

## 2015-12-13 DIAGNOSIS — J301 Allergic rhinitis due to pollen: Secondary | ICD-10-CM | POA: Diagnosis not present

## 2015-12-13 DIAGNOSIS — J3089 Other allergic rhinitis: Secondary | ICD-10-CM | POA: Diagnosis not present

## 2015-12-13 DIAGNOSIS — J3081 Allergic rhinitis due to animal (cat) (dog) hair and dander: Secondary | ICD-10-CM | POA: Diagnosis not present

## 2015-12-19 DIAGNOSIS — J3089 Other allergic rhinitis: Secondary | ICD-10-CM | POA: Diagnosis not present

## 2015-12-19 DIAGNOSIS — J301 Allergic rhinitis due to pollen: Secondary | ICD-10-CM | POA: Diagnosis not present

## 2015-12-19 DIAGNOSIS — J3081 Allergic rhinitis due to animal (cat) (dog) hair and dander: Secondary | ICD-10-CM | POA: Diagnosis not present

## 2015-12-26 DIAGNOSIS — J3081 Allergic rhinitis due to animal (cat) (dog) hair and dander: Secondary | ICD-10-CM | POA: Diagnosis not present

## 2015-12-26 DIAGNOSIS — J3089 Other allergic rhinitis: Secondary | ICD-10-CM | POA: Diagnosis not present

## 2015-12-26 DIAGNOSIS — J301 Allergic rhinitis due to pollen: Secondary | ICD-10-CM | POA: Diagnosis not present

## 2016-01-10 DIAGNOSIS — J3081 Allergic rhinitis due to animal (cat) (dog) hair and dander: Secondary | ICD-10-CM | POA: Diagnosis not present

## 2016-01-10 DIAGNOSIS — J301 Allergic rhinitis due to pollen: Secondary | ICD-10-CM | POA: Diagnosis not present

## 2016-01-10 DIAGNOSIS — J3089 Other allergic rhinitis: Secondary | ICD-10-CM | POA: Diagnosis not present

## 2016-01-16 DIAGNOSIS — J3089 Other allergic rhinitis: Secondary | ICD-10-CM | POA: Diagnosis not present

## 2016-01-16 DIAGNOSIS — J3081 Allergic rhinitis due to animal (cat) (dog) hair and dander: Secondary | ICD-10-CM | POA: Diagnosis not present

## 2016-01-16 DIAGNOSIS — J301 Allergic rhinitis due to pollen: Secondary | ICD-10-CM | POA: Diagnosis not present

## 2016-01-23 DIAGNOSIS — J3081 Allergic rhinitis due to animal (cat) (dog) hair and dander: Secondary | ICD-10-CM | POA: Diagnosis not present

## 2016-01-23 DIAGNOSIS — J301 Allergic rhinitis due to pollen: Secondary | ICD-10-CM | POA: Diagnosis not present

## 2016-01-23 DIAGNOSIS — J3089 Other allergic rhinitis: Secondary | ICD-10-CM | POA: Diagnosis not present

## 2016-01-30 DIAGNOSIS — J3081 Allergic rhinitis due to animal (cat) (dog) hair and dander: Secondary | ICD-10-CM | POA: Diagnosis not present

## 2016-01-30 DIAGNOSIS — J3089 Other allergic rhinitis: Secondary | ICD-10-CM | POA: Diagnosis not present

## 2016-01-30 DIAGNOSIS — J301 Allergic rhinitis due to pollen: Secondary | ICD-10-CM | POA: Diagnosis not present

## 2016-02-13 DIAGNOSIS — J3081 Allergic rhinitis due to animal (cat) (dog) hair and dander: Secondary | ICD-10-CM | POA: Diagnosis not present

## 2016-02-13 DIAGNOSIS — J3089 Other allergic rhinitis: Secondary | ICD-10-CM | POA: Diagnosis not present

## 2016-02-13 DIAGNOSIS — J301 Allergic rhinitis due to pollen: Secondary | ICD-10-CM | POA: Diagnosis not present

## 2016-02-20 DIAGNOSIS — J301 Allergic rhinitis due to pollen: Secondary | ICD-10-CM | POA: Diagnosis not present

## 2016-02-20 DIAGNOSIS — J3081 Allergic rhinitis due to animal (cat) (dog) hair and dander: Secondary | ICD-10-CM | POA: Diagnosis not present

## 2016-02-20 DIAGNOSIS — J3089 Other allergic rhinitis: Secondary | ICD-10-CM | POA: Diagnosis not present

## 2016-02-27 DIAGNOSIS — J3081 Allergic rhinitis due to animal (cat) (dog) hair and dander: Secondary | ICD-10-CM | POA: Diagnosis not present

## 2016-02-27 DIAGNOSIS — J301 Allergic rhinitis due to pollen: Secondary | ICD-10-CM | POA: Diagnosis not present

## 2016-02-27 DIAGNOSIS — J3089 Other allergic rhinitis: Secondary | ICD-10-CM | POA: Diagnosis not present

## 2016-03-03 DIAGNOSIS — J3081 Allergic rhinitis due to animal (cat) (dog) hair and dander: Secondary | ICD-10-CM | POA: Diagnosis not present

## 2016-03-03 DIAGNOSIS — J3089 Other allergic rhinitis: Secondary | ICD-10-CM | POA: Diagnosis not present

## 2016-03-03 DIAGNOSIS — J301 Allergic rhinitis due to pollen: Secondary | ICD-10-CM | POA: Diagnosis not present

## 2016-03-05 DIAGNOSIS — J3089 Other allergic rhinitis: Secondary | ICD-10-CM | POA: Diagnosis not present

## 2016-03-05 DIAGNOSIS — J3081 Allergic rhinitis due to animal (cat) (dog) hair and dander: Secondary | ICD-10-CM | POA: Diagnosis not present

## 2016-03-05 DIAGNOSIS — J301 Allergic rhinitis due to pollen: Secondary | ICD-10-CM | POA: Diagnosis not present

## 2016-03-13 DIAGNOSIS — J3089 Other allergic rhinitis: Secondary | ICD-10-CM | POA: Diagnosis not present

## 2016-03-13 DIAGNOSIS — J3081 Allergic rhinitis due to animal (cat) (dog) hair and dander: Secondary | ICD-10-CM | POA: Diagnosis not present

## 2016-03-13 DIAGNOSIS — J301 Allergic rhinitis due to pollen: Secondary | ICD-10-CM | POA: Diagnosis not present

## 2016-03-26 DIAGNOSIS — J3089 Other allergic rhinitis: Secondary | ICD-10-CM | POA: Diagnosis not present

## 2016-03-26 DIAGNOSIS — J3081 Allergic rhinitis due to animal (cat) (dog) hair and dander: Secondary | ICD-10-CM | POA: Diagnosis not present

## 2016-03-26 DIAGNOSIS — J301 Allergic rhinitis due to pollen: Secondary | ICD-10-CM | POA: Diagnosis not present

## 2016-03-27 DIAGNOSIS — Z23 Encounter for immunization: Secondary | ICD-10-CM | POA: Diagnosis not present

## 2016-04-09 DIAGNOSIS — J3081 Allergic rhinitis due to animal (cat) (dog) hair and dander: Secondary | ICD-10-CM | POA: Diagnosis not present

## 2016-04-09 DIAGNOSIS — J3089 Other allergic rhinitis: Secondary | ICD-10-CM | POA: Diagnosis not present

## 2016-04-09 DIAGNOSIS — J301 Allergic rhinitis due to pollen: Secondary | ICD-10-CM | POA: Diagnosis not present

## 2016-04-14 DIAGNOSIS — J3089 Other allergic rhinitis: Secondary | ICD-10-CM | POA: Diagnosis not present

## 2016-04-14 DIAGNOSIS — H1045 Other chronic allergic conjunctivitis: Secondary | ICD-10-CM | POA: Diagnosis not present

## 2016-04-14 DIAGNOSIS — J301 Allergic rhinitis due to pollen: Secondary | ICD-10-CM | POA: Diagnosis not present

## 2016-04-16 DIAGNOSIS — J3089 Other allergic rhinitis: Secondary | ICD-10-CM | POA: Diagnosis not present

## 2016-04-16 DIAGNOSIS — J3081 Allergic rhinitis due to animal (cat) (dog) hair and dander: Secondary | ICD-10-CM | POA: Diagnosis not present

## 2016-04-16 DIAGNOSIS — J301 Allergic rhinitis due to pollen: Secondary | ICD-10-CM | POA: Diagnosis not present

## 2016-04-21 DIAGNOSIS — J3081 Allergic rhinitis due to animal (cat) (dog) hair and dander: Secondary | ICD-10-CM | POA: Diagnosis not present

## 2016-04-21 DIAGNOSIS — J301 Allergic rhinitis due to pollen: Secondary | ICD-10-CM | POA: Diagnosis not present

## 2016-04-21 DIAGNOSIS — J3089 Other allergic rhinitis: Secondary | ICD-10-CM | POA: Diagnosis not present

## 2016-05-08 DIAGNOSIS — J3081 Allergic rhinitis due to animal (cat) (dog) hair and dander: Secondary | ICD-10-CM | POA: Diagnosis not present

## 2016-05-08 DIAGNOSIS — J301 Allergic rhinitis due to pollen: Secondary | ICD-10-CM | POA: Diagnosis not present

## 2016-05-08 DIAGNOSIS — J3089 Other allergic rhinitis: Secondary | ICD-10-CM | POA: Diagnosis not present

## 2016-05-14 DIAGNOSIS — J301 Allergic rhinitis due to pollen: Secondary | ICD-10-CM | POA: Diagnosis not present

## 2016-05-14 DIAGNOSIS — J3081 Allergic rhinitis due to animal (cat) (dog) hair and dander: Secondary | ICD-10-CM | POA: Diagnosis not present

## 2016-05-14 DIAGNOSIS — J3089 Other allergic rhinitis: Secondary | ICD-10-CM | POA: Diagnosis not present

## 2016-05-18 DIAGNOSIS — J3081 Allergic rhinitis due to animal (cat) (dog) hair and dander: Secondary | ICD-10-CM | POA: Diagnosis not present

## 2016-05-18 DIAGNOSIS — J301 Allergic rhinitis due to pollen: Secondary | ICD-10-CM | POA: Diagnosis not present

## 2016-05-18 DIAGNOSIS — J3089 Other allergic rhinitis: Secondary | ICD-10-CM | POA: Diagnosis not present

## 2016-05-28 DIAGNOSIS — E785 Hyperlipidemia, unspecified: Secondary | ICD-10-CM | POA: Diagnosis not present

## 2016-05-28 DIAGNOSIS — E039 Hypothyroidism, unspecified: Secondary | ICD-10-CM | POA: Diagnosis not present

## 2016-05-28 DIAGNOSIS — Z1389 Encounter for screening for other disorder: Secondary | ICD-10-CM | POA: Diagnosis not present

## 2016-05-28 DIAGNOSIS — R739 Hyperglycemia, unspecified: Secondary | ICD-10-CM | POA: Diagnosis not present

## 2016-05-28 DIAGNOSIS — Z0000 Encounter for general adult medical examination without abnormal findings: Secondary | ICD-10-CM | POA: Diagnosis not present

## 2016-05-28 DIAGNOSIS — Z6834 Body mass index (BMI) 34.0-34.9, adult: Secondary | ICD-10-CM | POA: Diagnosis not present

## 2016-05-28 DIAGNOSIS — Z0001 Encounter for general adult medical examination with abnormal findings: Secondary | ICD-10-CM | POA: Diagnosis not present

## 2016-06-01 DIAGNOSIS — E748 Other specified disorders of carbohydrate metabolism: Secondary | ICD-10-CM | POA: Diagnosis not present

## 2016-06-04 DIAGNOSIS — J301 Allergic rhinitis due to pollen: Secondary | ICD-10-CM | POA: Diagnosis not present

## 2016-06-04 DIAGNOSIS — J3081 Allergic rhinitis due to animal (cat) (dog) hair and dander: Secondary | ICD-10-CM | POA: Diagnosis not present

## 2016-06-04 DIAGNOSIS — J3089 Other allergic rhinitis: Secondary | ICD-10-CM | POA: Diagnosis not present

## 2016-06-11 DIAGNOSIS — J3089 Other allergic rhinitis: Secondary | ICD-10-CM | POA: Diagnosis not present

## 2016-06-11 DIAGNOSIS — J301 Allergic rhinitis due to pollen: Secondary | ICD-10-CM | POA: Diagnosis not present

## 2016-06-11 DIAGNOSIS — J3081 Allergic rhinitis due to animal (cat) (dog) hair and dander: Secondary | ICD-10-CM | POA: Diagnosis not present

## 2016-06-16 DIAGNOSIS — J3089 Other allergic rhinitis: Secondary | ICD-10-CM | POA: Diagnosis not present

## 2016-06-16 DIAGNOSIS — J301 Allergic rhinitis due to pollen: Secondary | ICD-10-CM | POA: Diagnosis not present

## 2016-06-16 DIAGNOSIS — J3081 Allergic rhinitis due to animal (cat) (dog) hair and dander: Secondary | ICD-10-CM | POA: Diagnosis not present

## 2016-06-26 ENCOUNTER — Other Ambulatory Visit (HOSPITAL_COMMUNITY): Payer: Self-pay | Admitting: Internal Medicine

## 2016-06-26 ENCOUNTER — Ambulatory Visit (HOSPITAL_COMMUNITY)
Admission: RE | Admit: 2016-06-26 | Discharge: 2016-06-26 | Disposition: A | Discharge status: Home / Self Care | Payer: Medicare Other | Source: Ambulatory Visit | Attending: Internal Medicine | Admitting: Internal Medicine

## 2016-06-26 DIAGNOSIS — Z1231 Encounter for screening mammogram for malignant neoplasm of breast: Secondary | ICD-10-CM | POA: Diagnosis not present

## 2016-07-02 DIAGNOSIS — J301 Allergic rhinitis due to pollen: Secondary | ICD-10-CM | POA: Diagnosis not present

## 2016-07-02 DIAGNOSIS — J3089 Other allergic rhinitis: Secondary | ICD-10-CM | POA: Diagnosis not present

## 2016-07-02 DIAGNOSIS — J3081 Allergic rhinitis due to animal (cat) (dog) hair and dander: Secondary | ICD-10-CM | POA: Diagnosis not present

## 2016-07-11 DIAGNOSIS — J019 Acute sinusitis, unspecified: Secondary | ICD-10-CM | POA: Diagnosis not present

## 2016-07-11 DIAGNOSIS — J9801 Acute bronchospasm: Secondary | ICD-10-CM | POA: Diagnosis not present

## 2016-07-23 DIAGNOSIS — J3081 Allergic rhinitis due to animal (cat) (dog) hair and dander: Secondary | ICD-10-CM | POA: Diagnosis not present

## 2016-07-23 DIAGNOSIS — J3089 Other allergic rhinitis: Secondary | ICD-10-CM | POA: Diagnosis not present

## 2016-07-23 DIAGNOSIS — J301 Allergic rhinitis due to pollen: Secondary | ICD-10-CM | POA: Diagnosis not present

## 2016-08-06 DIAGNOSIS — J301 Allergic rhinitis due to pollen: Secondary | ICD-10-CM | POA: Diagnosis not present

## 2016-08-06 DIAGNOSIS — J3081 Allergic rhinitis due to animal (cat) (dog) hair and dander: Secondary | ICD-10-CM | POA: Diagnosis not present

## 2016-08-06 DIAGNOSIS — J3089 Other allergic rhinitis: Secondary | ICD-10-CM | POA: Diagnosis not present

## 2016-08-13 DIAGNOSIS — J3081 Allergic rhinitis due to animal (cat) (dog) hair and dander: Secondary | ICD-10-CM | POA: Diagnosis not present

## 2016-08-13 DIAGNOSIS — J3089 Other allergic rhinitis: Secondary | ICD-10-CM | POA: Diagnosis not present

## 2016-08-13 DIAGNOSIS — J301 Allergic rhinitis due to pollen: Secondary | ICD-10-CM | POA: Diagnosis not present

## 2016-08-20 DIAGNOSIS — J3081 Allergic rhinitis due to animal (cat) (dog) hair and dander: Secondary | ICD-10-CM | POA: Diagnosis not present

## 2016-08-20 DIAGNOSIS — J301 Allergic rhinitis due to pollen: Secondary | ICD-10-CM | POA: Diagnosis not present

## 2016-08-20 DIAGNOSIS — J3089 Other allergic rhinitis: Secondary | ICD-10-CM | POA: Diagnosis not present

## 2016-09-03 DIAGNOSIS — J301 Allergic rhinitis due to pollen: Secondary | ICD-10-CM | POA: Diagnosis not present

## 2016-09-03 DIAGNOSIS — J3081 Allergic rhinitis due to animal (cat) (dog) hair and dander: Secondary | ICD-10-CM | POA: Diagnosis not present

## 2016-09-03 DIAGNOSIS — J3089 Other allergic rhinitis: Secondary | ICD-10-CM | POA: Diagnosis not present

## 2016-09-10 DIAGNOSIS — J3089 Other allergic rhinitis: Secondary | ICD-10-CM | POA: Diagnosis not present

## 2016-09-10 DIAGNOSIS — J3081 Allergic rhinitis due to animal (cat) (dog) hair and dander: Secondary | ICD-10-CM | POA: Diagnosis not present

## 2016-09-10 DIAGNOSIS — J301 Allergic rhinitis due to pollen: Secondary | ICD-10-CM | POA: Diagnosis not present

## 2016-09-17 DIAGNOSIS — J3089 Other allergic rhinitis: Secondary | ICD-10-CM | POA: Diagnosis not present

## 2016-09-17 DIAGNOSIS — J3081 Allergic rhinitis due to animal (cat) (dog) hair and dander: Secondary | ICD-10-CM | POA: Diagnosis not present

## 2016-09-17 DIAGNOSIS — J301 Allergic rhinitis due to pollen: Secondary | ICD-10-CM | POA: Diagnosis not present

## 2016-09-24 DIAGNOSIS — J301 Allergic rhinitis due to pollen: Secondary | ICD-10-CM | POA: Diagnosis not present

## 2016-09-24 DIAGNOSIS — J3081 Allergic rhinitis due to animal (cat) (dog) hair and dander: Secondary | ICD-10-CM | POA: Diagnosis not present

## 2016-09-24 DIAGNOSIS — J3089 Other allergic rhinitis: Secondary | ICD-10-CM | POA: Diagnosis not present

## 2016-10-01 DIAGNOSIS — J3089 Other allergic rhinitis: Secondary | ICD-10-CM | POA: Diagnosis not present

## 2016-10-01 DIAGNOSIS — J301 Allergic rhinitis due to pollen: Secondary | ICD-10-CM | POA: Diagnosis not present

## 2016-10-01 DIAGNOSIS — J3081 Allergic rhinitis due to animal (cat) (dog) hair and dander: Secondary | ICD-10-CM | POA: Diagnosis not present

## 2016-10-08 DIAGNOSIS — J301 Allergic rhinitis due to pollen: Secondary | ICD-10-CM | POA: Diagnosis not present

## 2016-10-08 DIAGNOSIS — J3089 Other allergic rhinitis: Secondary | ICD-10-CM | POA: Diagnosis not present

## 2016-10-08 DIAGNOSIS — J3081 Allergic rhinitis due to animal (cat) (dog) hair and dander: Secondary | ICD-10-CM | POA: Diagnosis not present

## 2016-10-15 DIAGNOSIS — J3089 Other allergic rhinitis: Secondary | ICD-10-CM | POA: Diagnosis not present

## 2016-10-15 DIAGNOSIS — J3081 Allergic rhinitis due to animal (cat) (dog) hair and dander: Secondary | ICD-10-CM | POA: Diagnosis not present

## 2016-10-15 DIAGNOSIS — J301 Allergic rhinitis due to pollen: Secondary | ICD-10-CM | POA: Diagnosis not present

## 2016-10-16 DIAGNOSIS — J3089 Other allergic rhinitis: Secondary | ICD-10-CM | POA: Diagnosis not present

## 2016-10-16 DIAGNOSIS — J3081 Allergic rhinitis due to animal (cat) (dog) hair and dander: Secondary | ICD-10-CM | POA: Diagnosis not present

## 2016-10-22 DIAGNOSIS — J301 Allergic rhinitis due to pollen: Secondary | ICD-10-CM | POA: Diagnosis not present

## 2016-10-22 DIAGNOSIS — J3089 Other allergic rhinitis: Secondary | ICD-10-CM | POA: Diagnosis not present

## 2016-10-22 DIAGNOSIS — J3081 Allergic rhinitis due to animal (cat) (dog) hair and dander: Secondary | ICD-10-CM | POA: Diagnosis not present

## 2016-11-06 DIAGNOSIS — J3089 Other allergic rhinitis: Secondary | ICD-10-CM | POA: Diagnosis not present

## 2016-11-06 DIAGNOSIS — J3081 Allergic rhinitis due to animal (cat) (dog) hair and dander: Secondary | ICD-10-CM | POA: Diagnosis not present

## 2016-11-06 DIAGNOSIS — J301 Allergic rhinitis due to pollen: Secondary | ICD-10-CM | POA: Diagnosis not present

## 2016-11-13 DIAGNOSIS — J3081 Allergic rhinitis due to animal (cat) (dog) hair and dander: Secondary | ICD-10-CM | POA: Diagnosis not present

## 2016-11-13 DIAGNOSIS — J301 Allergic rhinitis due to pollen: Secondary | ICD-10-CM | POA: Diagnosis not present

## 2016-11-13 DIAGNOSIS — J3089 Other allergic rhinitis: Secondary | ICD-10-CM | POA: Diagnosis not present

## 2016-11-19 DIAGNOSIS — J301 Allergic rhinitis due to pollen: Secondary | ICD-10-CM | POA: Diagnosis not present

## 2016-11-19 DIAGNOSIS — J3081 Allergic rhinitis due to animal (cat) (dog) hair and dander: Secondary | ICD-10-CM | POA: Diagnosis not present

## 2016-11-19 DIAGNOSIS — J3089 Other allergic rhinitis: Secondary | ICD-10-CM | POA: Diagnosis not present

## 2016-11-26 DIAGNOSIS — J3089 Other allergic rhinitis: Secondary | ICD-10-CM | POA: Diagnosis not present

## 2016-11-26 DIAGNOSIS — J301 Allergic rhinitis due to pollen: Secondary | ICD-10-CM | POA: Diagnosis not present

## 2016-11-26 DIAGNOSIS — J3081 Allergic rhinitis due to animal (cat) (dog) hair and dander: Secondary | ICD-10-CM | POA: Diagnosis not present

## 2016-12-03 DIAGNOSIS — J301 Allergic rhinitis due to pollen: Secondary | ICD-10-CM | POA: Diagnosis not present

## 2016-12-03 DIAGNOSIS — J3089 Other allergic rhinitis: Secondary | ICD-10-CM | POA: Diagnosis not present

## 2016-12-10 DIAGNOSIS — J301 Allergic rhinitis due to pollen: Secondary | ICD-10-CM | POA: Diagnosis not present

## 2016-12-10 DIAGNOSIS — J3089 Other allergic rhinitis: Secondary | ICD-10-CM | POA: Diagnosis not present

## 2016-12-17 DIAGNOSIS — J3089 Other allergic rhinitis: Secondary | ICD-10-CM | POA: Diagnosis not present

## 2016-12-17 DIAGNOSIS — J3081 Allergic rhinitis due to animal (cat) (dog) hair and dander: Secondary | ICD-10-CM | POA: Diagnosis not present

## 2016-12-17 DIAGNOSIS — J301 Allergic rhinitis due to pollen: Secondary | ICD-10-CM | POA: Diagnosis not present

## 2016-12-23 DIAGNOSIS — J3081 Allergic rhinitis due to animal (cat) (dog) hair and dander: Secondary | ICD-10-CM | POA: Diagnosis not present

## 2016-12-23 DIAGNOSIS — J301 Allergic rhinitis due to pollen: Secondary | ICD-10-CM | POA: Diagnosis not present

## 2016-12-23 DIAGNOSIS — J3089 Other allergic rhinitis: Secondary | ICD-10-CM | POA: Diagnosis not present

## 2016-12-31 DIAGNOSIS — J301 Allergic rhinitis due to pollen: Secondary | ICD-10-CM | POA: Diagnosis not present

## 2016-12-31 DIAGNOSIS — J3081 Allergic rhinitis due to animal (cat) (dog) hair and dander: Secondary | ICD-10-CM | POA: Diagnosis not present

## 2016-12-31 DIAGNOSIS — J3089 Other allergic rhinitis: Secondary | ICD-10-CM | POA: Diagnosis not present

## 2017-01-07 DIAGNOSIS — J3089 Other allergic rhinitis: Secondary | ICD-10-CM | POA: Diagnosis not present

## 2017-01-07 DIAGNOSIS — J301 Allergic rhinitis due to pollen: Secondary | ICD-10-CM | POA: Diagnosis not present

## 2017-01-07 DIAGNOSIS — J3081 Allergic rhinitis due to animal (cat) (dog) hair and dander: Secondary | ICD-10-CM | POA: Diagnosis not present

## 2017-01-14 DIAGNOSIS — J301 Allergic rhinitis due to pollen: Secondary | ICD-10-CM | POA: Diagnosis not present

## 2017-01-14 DIAGNOSIS — J3081 Allergic rhinitis due to animal (cat) (dog) hair and dander: Secondary | ICD-10-CM | POA: Diagnosis not present

## 2017-01-14 DIAGNOSIS — J3089 Other allergic rhinitis: Secondary | ICD-10-CM | POA: Diagnosis not present

## 2017-01-21 DIAGNOSIS — J3089 Other allergic rhinitis: Secondary | ICD-10-CM | POA: Diagnosis not present

## 2017-01-21 DIAGNOSIS — J3081 Allergic rhinitis due to animal (cat) (dog) hair and dander: Secondary | ICD-10-CM | POA: Diagnosis not present

## 2017-01-21 DIAGNOSIS — J301 Allergic rhinitis due to pollen: Secondary | ICD-10-CM | POA: Diagnosis not present

## 2017-01-28 DIAGNOSIS — J3081 Allergic rhinitis due to animal (cat) (dog) hair and dander: Secondary | ICD-10-CM | POA: Diagnosis not present

## 2017-01-28 DIAGNOSIS — J301 Allergic rhinitis due to pollen: Secondary | ICD-10-CM | POA: Diagnosis not present

## 2017-01-28 DIAGNOSIS — J3089 Other allergic rhinitis: Secondary | ICD-10-CM | POA: Diagnosis not present

## 2017-02-04 DIAGNOSIS — J301 Allergic rhinitis due to pollen: Secondary | ICD-10-CM | POA: Diagnosis not present

## 2017-02-04 DIAGNOSIS — J3081 Allergic rhinitis due to animal (cat) (dog) hair and dander: Secondary | ICD-10-CM | POA: Diagnosis not present

## 2017-02-04 DIAGNOSIS — J3089 Other allergic rhinitis: Secondary | ICD-10-CM | POA: Diagnosis not present

## 2017-02-18 DIAGNOSIS — J301 Allergic rhinitis due to pollen: Secondary | ICD-10-CM | POA: Diagnosis not present

## 2017-02-18 DIAGNOSIS — J3089 Other allergic rhinitis: Secondary | ICD-10-CM | POA: Diagnosis not present

## 2017-02-18 DIAGNOSIS — J3081 Allergic rhinitis due to animal (cat) (dog) hair and dander: Secondary | ICD-10-CM | POA: Diagnosis not present

## 2017-02-25 DIAGNOSIS — J3089 Other allergic rhinitis: Secondary | ICD-10-CM | POA: Diagnosis not present

## 2017-02-25 DIAGNOSIS — J301 Allergic rhinitis due to pollen: Secondary | ICD-10-CM | POA: Diagnosis not present

## 2017-02-25 DIAGNOSIS — J3081 Allergic rhinitis due to animal (cat) (dog) hair and dander: Secondary | ICD-10-CM | POA: Diagnosis not present

## 2017-03-04 DIAGNOSIS — J301 Allergic rhinitis due to pollen: Secondary | ICD-10-CM | POA: Diagnosis not present

## 2017-03-04 DIAGNOSIS — J3081 Allergic rhinitis due to animal (cat) (dog) hair and dander: Secondary | ICD-10-CM | POA: Diagnosis not present

## 2017-03-04 DIAGNOSIS — J3089 Other allergic rhinitis: Secondary | ICD-10-CM | POA: Diagnosis not present

## 2017-03-11 DIAGNOSIS — J3081 Allergic rhinitis due to animal (cat) (dog) hair and dander: Secondary | ICD-10-CM | POA: Diagnosis not present

## 2017-03-11 DIAGNOSIS — J301 Allergic rhinitis due to pollen: Secondary | ICD-10-CM | POA: Diagnosis not present

## 2017-03-11 DIAGNOSIS — J3089 Other allergic rhinitis: Secondary | ICD-10-CM | POA: Diagnosis not present

## 2017-03-18 DIAGNOSIS — J3081 Allergic rhinitis due to animal (cat) (dog) hair and dander: Secondary | ICD-10-CM | POA: Diagnosis not present

## 2017-03-18 DIAGNOSIS — J3089 Other allergic rhinitis: Secondary | ICD-10-CM | POA: Diagnosis not present

## 2017-03-18 DIAGNOSIS — J301 Allergic rhinitis due to pollen: Secondary | ICD-10-CM | POA: Diagnosis not present

## 2017-04-07 DIAGNOSIS — J301 Allergic rhinitis due to pollen: Secondary | ICD-10-CM | POA: Diagnosis not present

## 2017-04-07 DIAGNOSIS — J3081 Allergic rhinitis due to animal (cat) (dog) hair and dander: Secondary | ICD-10-CM | POA: Diagnosis not present

## 2017-04-07 DIAGNOSIS — J3089 Other allergic rhinitis: Secondary | ICD-10-CM | POA: Diagnosis not present

## 2017-04-13 DIAGNOSIS — Z23 Encounter for immunization: Secondary | ICD-10-CM | POA: Diagnosis not present

## 2017-04-13 DIAGNOSIS — H1045 Other chronic allergic conjunctivitis: Secondary | ICD-10-CM | POA: Diagnosis not present

## 2017-04-13 DIAGNOSIS — R05 Cough: Secondary | ICD-10-CM | POA: Diagnosis not present

## 2017-04-13 DIAGNOSIS — J3089 Other allergic rhinitis: Secondary | ICD-10-CM | POA: Diagnosis not present

## 2017-04-13 DIAGNOSIS — J301 Allergic rhinitis due to pollen: Secondary | ICD-10-CM | POA: Diagnosis not present

## 2017-04-15 DIAGNOSIS — J3089 Other allergic rhinitis: Secondary | ICD-10-CM | POA: Diagnosis not present

## 2017-04-15 DIAGNOSIS — J3081 Allergic rhinitis due to animal (cat) (dog) hair and dander: Secondary | ICD-10-CM | POA: Diagnosis not present

## 2017-04-15 DIAGNOSIS — J301 Allergic rhinitis due to pollen: Secondary | ICD-10-CM | POA: Diagnosis not present

## 2017-04-19 DIAGNOSIS — J3081 Allergic rhinitis due to animal (cat) (dog) hair and dander: Secondary | ICD-10-CM | POA: Diagnosis not present

## 2017-04-19 DIAGNOSIS — J3089 Other allergic rhinitis: Secondary | ICD-10-CM | POA: Diagnosis not present

## 2017-04-22 DIAGNOSIS — J301 Allergic rhinitis due to pollen: Secondary | ICD-10-CM | POA: Diagnosis not present

## 2017-04-22 DIAGNOSIS — J3089 Other allergic rhinitis: Secondary | ICD-10-CM | POA: Diagnosis not present

## 2017-05-13 DIAGNOSIS — J301 Allergic rhinitis due to pollen: Secondary | ICD-10-CM | POA: Diagnosis not present

## 2017-05-13 DIAGNOSIS — J3081 Allergic rhinitis due to animal (cat) (dog) hair and dander: Secondary | ICD-10-CM | POA: Diagnosis not present

## 2017-05-13 DIAGNOSIS — J3089 Other allergic rhinitis: Secondary | ICD-10-CM | POA: Diagnosis not present

## 2017-05-18 DIAGNOSIS — J3081 Allergic rhinitis due to animal (cat) (dog) hair and dander: Secondary | ICD-10-CM | POA: Diagnosis not present

## 2017-05-18 DIAGNOSIS — J301 Allergic rhinitis due to pollen: Secondary | ICD-10-CM | POA: Diagnosis not present

## 2017-05-18 DIAGNOSIS — J3089 Other allergic rhinitis: Secondary | ICD-10-CM | POA: Diagnosis not present

## 2017-05-27 DIAGNOSIS — J3089 Other allergic rhinitis: Secondary | ICD-10-CM | POA: Diagnosis not present

## 2017-05-27 DIAGNOSIS — J301 Allergic rhinitis due to pollen: Secondary | ICD-10-CM | POA: Diagnosis not present

## 2017-05-27 DIAGNOSIS — J3081 Allergic rhinitis due to animal (cat) (dog) hair and dander: Secondary | ICD-10-CM | POA: Diagnosis not present

## 2017-05-28 ENCOUNTER — Other Ambulatory Visit (HOSPITAL_COMMUNITY): Payer: Self-pay | Admitting: Internal Medicine

## 2017-05-28 DIAGNOSIS — Z1231 Encounter for screening mammogram for malignant neoplasm of breast: Secondary | ICD-10-CM

## 2017-05-28 DIAGNOSIS — J301 Allergic rhinitis due to pollen: Secondary | ICD-10-CM | POA: Diagnosis not present

## 2017-06-03 DIAGNOSIS — J3089 Other allergic rhinitis: Secondary | ICD-10-CM | POA: Diagnosis not present

## 2017-06-03 DIAGNOSIS — J301 Allergic rhinitis due to pollen: Secondary | ICD-10-CM | POA: Diagnosis not present

## 2017-06-03 DIAGNOSIS — J3081 Allergic rhinitis due to animal (cat) (dog) hair and dander: Secondary | ICD-10-CM | POA: Diagnosis not present

## 2017-06-10 DIAGNOSIS — J3089 Other allergic rhinitis: Secondary | ICD-10-CM | POA: Diagnosis not present

## 2017-06-10 DIAGNOSIS — J301 Allergic rhinitis due to pollen: Secondary | ICD-10-CM | POA: Diagnosis not present

## 2017-06-10 DIAGNOSIS — J3081 Allergic rhinitis due to animal (cat) (dog) hair and dander: Secondary | ICD-10-CM | POA: Diagnosis not present

## 2017-06-24 DIAGNOSIS — J301 Allergic rhinitis due to pollen: Secondary | ICD-10-CM | POA: Diagnosis not present

## 2017-06-24 DIAGNOSIS — J3089 Other allergic rhinitis: Secondary | ICD-10-CM | POA: Diagnosis not present

## 2017-06-28 ENCOUNTER — Encounter (HOSPITAL_COMMUNITY): Payer: Self-pay

## 2017-06-28 ENCOUNTER — Ambulatory Visit (HOSPITAL_COMMUNITY)
Admission: RE | Admit: 2017-06-28 | Discharge: 2017-06-28 | Disposition: A | Discharge status: Home / Self Care | Payer: Medicare Other | Source: Ambulatory Visit | Attending: Internal Medicine | Admitting: Internal Medicine

## 2017-06-28 DIAGNOSIS — Z1231 Encounter for screening mammogram for malignant neoplasm of breast: Secondary | ICD-10-CM | POA: Diagnosis not present

## 2017-07-09 DIAGNOSIS — J3089 Other allergic rhinitis: Secondary | ICD-10-CM | POA: Diagnosis not present

## 2017-07-09 DIAGNOSIS — J3081 Allergic rhinitis due to animal (cat) (dog) hair and dander: Secondary | ICD-10-CM | POA: Diagnosis not present

## 2017-07-09 DIAGNOSIS — J301 Allergic rhinitis due to pollen: Secondary | ICD-10-CM | POA: Diagnosis not present

## 2017-07-22 DIAGNOSIS — J301 Allergic rhinitis due to pollen: Secondary | ICD-10-CM | POA: Diagnosis not present

## 2017-07-22 DIAGNOSIS — J3081 Allergic rhinitis due to animal (cat) (dog) hair and dander: Secondary | ICD-10-CM | POA: Diagnosis not present

## 2017-07-22 DIAGNOSIS — J3089 Other allergic rhinitis: Secondary | ICD-10-CM | POA: Diagnosis not present

## 2017-08-05 DIAGNOSIS — J301 Allergic rhinitis due to pollen: Secondary | ICD-10-CM | POA: Diagnosis not present

## 2017-08-05 DIAGNOSIS — J3081 Allergic rhinitis due to animal (cat) (dog) hair and dander: Secondary | ICD-10-CM | POA: Diagnosis not present

## 2017-08-05 DIAGNOSIS — J3089 Other allergic rhinitis: Secondary | ICD-10-CM | POA: Diagnosis not present

## 2017-08-10 DIAGNOSIS — E748 Other specified disorders of carbohydrate metabolism: Secondary | ICD-10-CM | POA: Diagnosis not present

## 2017-08-10 DIAGNOSIS — Z0001 Encounter for general adult medical examination with abnormal findings: Secondary | ICD-10-CM | POA: Diagnosis not present

## 2017-08-10 DIAGNOSIS — Z1389 Encounter for screening for other disorder: Secondary | ICD-10-CM | POA: Diagnosis not present

## 2017-08-10 DIAGNOSIS — Z6834 Body mass index (BMI) 34.0-34.9, adult: Secondary | ICD-10-CM | POA: Diagnosis not present

## 2017-08-10 DIAGNOSIS — E785 Hyperlipidemia, unspecified: Secondary | ICD-10-CM | POA: Diagnosis not present

## 2017-08-10 DIAGNOSIS — E669 Obesity, unspecified: Secondary | ICD-10-CM | POA: Diagnosis not present

## 2017-08-10 DIAGNOSIS — R846 Abnormal cytological findings in specimens from respiratory organs and thorax: Secondary | ICD-10-CM | POA: Diagnosis not present

## 2017-08-10 DIAGNOSIS — E6609 Other obesity due to excess calories: Secondary | ICD-10-CM | POA: Diagnosis not present

## 2017-08-18 DIAGNOSIS — E669 Obesity, unspecified: Secondary | ICD-10-CM | POA: Diagnosis not present

## 2017-08-18 DIAGNOSIS — R739 Hyperglycemia, unspecified: Secondary | ICD-10-CM | POA: Diagnosis not present

## 2017-08-20 DIAGNOSIS — J3081 Allergic rhinitis due to animal (cat) (dog) hair and dander: Secondary | ICD-10-CM | POA: Diagnosis not present

## 2017-08-20 DIAGNOSIS — J3089 Other allergic rhinitis: Secondary | ICD-10-CM | POA: Diagnosis not present

## 2017-08-20 DIAGNOSIS — J301 Allergic rhinitis due to pollen: Secondary | ICD-10-CM | POA: Diagnosis not present

## 2017-08-26 DIAGNOSIS — J3081 Allergic rhinitis due to animal (cat) (dog) hair and dander: Secondary | ICD-10-CM | POA: Diagnosis not present

## 2017-08-26 DIAGNOSIS — J3089 Other allergic rhinitis: Secondary | ICD-10-CM | POA: Diagnosis not present

## 2017-08-26 DIAGNOSIS — J301 Allergic rhinitis due to pollen: Secondary | ICD-10-CM | POA: Diagnosis not present

## 2017-09-09 DIAGNOSIS — J3081 Allergic rhinitis due to animal (cat) (dog) hair and dander: Secondary | ICD-10-CM | POA: Diagnosis not present

## 2017-09-09 DIAGNOSIS — J301 Allergic rhinitis due to pollen: Secondary | ICD-10-CM | POA: Diagnosis not present

## 2017-09-09 DIAGNOSIS — J3089 Other allergic rhinitis: Secondary | ICD-10-CM | POA: Diagnosis not present

## 2017-09-16 DIAGNOSIS — Z6834 Body mass index (BMI) 34.0-34.9, adult: Secondary | ICD-10-CM | POA: Diagnosis not present

## 2017-09-16 DIAGNOSIS — E2839 Other primary ovarian failure: Secondary | ICD-10-CM | POA: Diagnosis not present

## 2017-09-16 DIAGNOSIS — Z1389 Encounter for screening for other disorder: Secondary | ICD-10-CM | POA: Diagnosis not present

## 2017-09-16 DIAGNOSIS — J301 Allergic rhinitis due to pollen: Secondary | ICD-10-CM | POA: Diagnosis not present

## 2017-09-16 DIAGNOSIS — M85852 Other specified disorders of bone density and structure, left thigh: Secondary | ICD-10-CM | POA: Diagnosis not present

## 2017-09-16 DIAGNOSIS — J3081 Allergic rhinitis due to animal (cat) (dog) hair and dander: Secondary | ICD-10-CM | POA: Diagnosis not present

## 2017-09-16 DIAGNOSIS — J3089 Other allergic rhinitis: Secondary | ICD-10-CM | POA: Diagnosis not present

## 2017-09-16 DIAGNOSIS — M8589 Other specified disorders of bone density and structure, multiple sites: Secondary | ICD-10-CM | POA: Diagnosis not present

## 2017-09-22 DIAGNOSIS — J3089 Other allergic rhinitis: Secondary | ICD-10-CM | POA: Diagnosis not present

## 2017-09-22 DIAGNOSIS — J301 Allergic rhinitis due to pollen: Secondary | ICD-10-CM | POA: Diagnosis not present

## 2017-09-22 DIAGNOSIS — J3081 Allergic rhinitis due to animal (cat) (dog) hair and dander: Secondary | ICD-10-CM | POA: Diagnosis not present

## 2017-10-07 DIAGNOSIS — J3081 Allergic rhinitis due to animal (cat) (dog) hair and dander: Secondary | ICD-10-CM | POA: Diagnosis not present

## 2017-10-07 DIAGNOSIS — J301 Allergic rhinitis due to pollen: Secondary | ICD-10-CM | POA: Diagnosis not present

## 2017-10-07 DIAGNOSIS — J3089 Other allergic rhinitis: Secondary | ICD-10-CM | POA: Diagnosis not present

## 2017-10-22 DIAGNOSIS — J3089 Other allergic rhinitis: Secondary | ICD-10-CM | POA: Diagnosis not present

## 2017-10-22 DIAGNOSIS — J3081 Allergic rhinitis due to animal (cat) (dog) hair and dander: Secondary | ICD-10-CM | POA: Diagnosis not present

## 2017-10-22 DIAGNOSIS — J301 Allergic rhinitis due to pollen: Secondary | ICD-10-CM | POA: Diagnosis not present

## 2017-11-04 DIAGNOSIS — J3089 Other allergic rhinitis: Secondary | ICD-10-CM | POA: Diagnosis not present

## 2017-11-04 DIAGNOSIS — J3081 Allergic rhinitis due to animal (cat) (dog) hair and dander: Secondary | ICD-10-CM | POA: Diagnosis not present

## 2017-11-04 DIAGNOSIS — J301 Allergic rhinitis due to pollen: Secondary | ICD-10-CM | POA: Diagnosis not present

## 2017-11-18 DIAGNOSIS — J3089 Other allergic rhinitis: Secondary | ICD-10-CM | POA: Diagnosis not present

## 2017-11-18 DIAGNOSIS — J301 Allergic rhinitis due to pollen: Secondary | ICD-10-CM | POA: Diagnosis not present

## 2017-11-25 DIAGNOSIS — J3089 Other allergic rhinitis: Secondary | ICD-10-CM | POA: Diagnosis not present

## 2017-11-25 DIAGNOSIS — J3081 Allergic rhinitis due to animal (cat) (dog) hair and dander: Secondary | ICD-10-CM | POA: Diagnosis not present

## 2017-11-25 DIAGNOSIS — J301 Allergic rhinitis due to pollen: Secondary | ICD-10-CM | POA: Diagnosis not present

## 2017-12-02 DIAGNOSIS — J3089 Other allergic rhinitis: Secondary | ICD-10-CM | POA: Diagnosis not present

## 2017-12-02 DIAGNOSIS — J3081 Allergic rhinitis due to animal (cat) (dog) hair and dander: Secondary | ICD-10-CM | POA: Diagnosis not present

## 2017-12-02 DIAGNOSIS — J301 Allergic rhinitis due to pollen: Secondary | ICD-10-CM | POA: Diagnosis not present

## 2017-12-16 DIAGNOSIS — J3081 Allergic rhinitis due to animal (cat) (dog) hair and dander: Secondary | ICD-10-CM | POA: Diagnosis not present

## 2017-12-16 DIAGNOSIS — J3089 Other allergic rhinitis: Secondary | ICD-10-CM | POA: Diagnosis not present

## 2017-12-16 DIAGNOSIS — J301 Allergic rhinitis due to pollen: Secondary | ICD-10-CM | POA: Diagnosis not present

## 2017-12-23 DIAGNOSIS — J3089 Other allergic rhinitis: Secondary | ICD-10-CM | POA: Diagnosis not present

## 2017-12-23 DIAGNOSIS — J3081 Allergic rhinitis due to animal (cat) (dog) hair and dander: Secondary | ICD-10-CM | POA: Diagnosis not present

## 2017-12-23 DIAGNOSIS — J301 Allergic rhinitis due to pollen: Secondary | ICD-10-CM | POA: Diagnosis not present

## 2017-12-27 DIAGNOSIS — J3081 Allergic rhinitis due to animal (cat) (dog) hair and dander: Secondary | ICD-10-CM | POA: Diagnosis not present

## 2017-12-27 DIAGNOSIS — J3089 Other allergic rhinitis: Secondary | ICD-10-CM | POA: Diagnosis not present

## 2018-01-06 DIAGNOSIS — J301 Allergic rhinitis due to pollen: Secondary | ICD-10-CM | POA: Diagnosis not present

## 2018-01-06 DIAGNOSIS — J3089 Other allergic rhinitis: Secondary | ICD-10-CM | POA: Diagnosis not present

## 2018-01-06 DIAGNOSIS — J3081 Allergic rhinitis due to animal (cat) (dog) hair and dander: Secondary | ICD-10-CM | POA: Diagnosis not present

## 2018-01-13 DIAGNOSIS — J301 Allergic rhinitis due to pollen: Secondary | ICD-10-CM | POA: Diagnosis not present

## 2018-01-13 DIAGNOSIS — J3081 Allergic rhinitis due to animal (cat) (dog) hair and dander: Secondary | ICD-10-CM | POA: Diagnosis not present

## 2018-01-13 DIAGNOSIS — J3089 Other allergic rhinitis: Secondary | ICD-10-CM | POA: Diagnosis not present

## 2018-01-27 DIAGNOSIS — J3089 Other allergic rhinitis: Secondary | ICD-10-CM | POA: Diagnosis not present

## 2018-01-27 DIAGNOSIS — J301 Allergic rhinitis due to pollen: Secondary | ICD-10-CM | POA: Diagnosis not present

## 2018-01-27 DIAGNOSIS — J3081 Allergic rhinitis due to animal (cat) (dog) hair and dander: Secondary | ICD-10-CM | POA: Diagnosis not present

## 2018-02-10 DIAGNOSIS — J301 Allergic rhinitis due to pollen: Secondary | ICD-10-CM | POA: Diagnosis not present

## 2018-02-10 DIAGNOSIS — J3081 Allergic rhinitis due to animal (cat) (dog) hair and dander: Secondary | ICD-10-CM | POA: Diagnosis not present

## 2018-02-10 DIAGNOSIS — J3089 Other allergic rhinitis: Secondary | ICD-10-CM | POA: Diagnosis not present

## 2018-02-17 DIAGNOSIS — J301 Allergic rhinitis due to pollen: Secondary | ICD-10-CM | POA: Diagnosis not present

## 2018-02-17 DIAGNOSIS — J3081 Allergic rhinitis due to animal (cat) (dog) hair and dander: Secondary | ICD-10-CM | POA: Diagnosis not present

## 2018-02-17 DIAGNOSIS — J3089 Other allergic rhinitis: Secondary | ICD-10-CM | POA: Diagnosis not present

## 2018-02-24 DIAGNOSIS — J3089 Other allergic rhinitis: Secondary | ICD-10-CM | POA: Diagnosis not present

## 2018-02-24 DIAGNOSIS — J3081 Allergic rhinitis due to animal (cat) (dog) hair and dander: Secondary | ICD-10-CM | POA: Diagnosis not present

## 2018-02-24 DIAGNOSIS — J301 Allergic rhinitis due to pollen: Secondary | ICD-10-CM | POA: Diagnosis not present

## 2018-03-03 DIAGNOSIS — J3089 Other allergic rhinitis: Secondary | ICD-10-CM | POA: Diagnosis not present

## 2018-03-03 DIAGNOSIS — J301 Allergic rhinitis due to pollen: Secondary | ICD-10-CM | POA: Diagnosis not present

## 2018-03-03 DIAGNOSIS — J3081 Allergic rhinitis due to animal (cat) (dog) hair and dander: Secondary | ICD-10-CM | POA: Diagnosis not present

## 2018-03-07 DIAGNOSIS — J3089 Other allergic rhinitis: Secondary | ICD-10-CM | POA: Diagnosis not present

## 2018-03-07 DIAGNOSIS — J301 Allergic rhinitis due to pollen: Secondary | ICD-10-CM | POA: Diagnosis not present

## 2018-03-07 DIAGNOSIS — J3081 Allergic rhinitis due to animal (cat) (dog) hair and dander: Secondary | ICD-10-CM | POA: Diagnosis not present

## 2018-03-24 DIAGNOSIS — J301 Allergic rhinitis due to pollen: Secondary | ICD-10-CM | POA: Diagnosis not present

## 2018-03-24 DIAGNOSIS — J3081 Allergic rhinitis due to animal (cat) (dog) hair and dander: Secondary | ICD-10-CM | POA: Diagnosis not present

## 2018-03-24 DIAGNOSIS — J3089 Other allergic rhinitis: Secondary | ICD-10-CM | POA: Diagnosis not present

## 2018-03-27 DIAGNOSIS — Z23 Encounter for immunization: Secondary | ICD-10-CM | POA: Diagnosis not present

## 2018-04-07 DIAGNOSIS — J3081 Allergic rhinitis due to animal (cat) (dog) hair and dander: Secondary | ICD-10-CM | POA: Diagnosis not present

## 2018-04-07 DIAGNOSIS — J3089 Other allergic rhinitis: Secondary | ICD-10-CM | POA: Diagnosis not present

## 2018-04-07 DIAGNOSIS — J301 Allergic rhinitis due to pollen: Secondary | ICD-10-CM | POA: Diagnosis not present

## 2018-04-13 DIAGNOSIS — R05 Cough: Secondary | ICD-10-CM | POA: Diagnosis not present

## 2018-04-13 DIAGNOSIS — J3081 Allergic rhinitis due to animal (cat) (dog) hair and dander: Secondary | ICD-10-CM | POA: Diagnosis not present

## 2018-04-13 DIAGNOSIS — J301 Allergic rhinitis due to pollen: Secondary | ICD-10-CM | POA: Diagnosis not present

## 2018-04-13 DIAGNOSIS — H1045 Other chronic allergic conjunctivitis: Secondary | ICD-10-CM | POA: Diagnosis not present

## 2018-04-13 DIAGNOSIS — J3089 Other allergic rhinitis: Secondary | ICD-10-CM | POA: Diagnosis not present

## 2018-04-21 DIAGNOSIS — J3081 Allergic rhinitis due to animal (cat) (dog) hair and dander: Secondary | ICD-10-CM | POA: Diagnosis not present

## 2018-04-21 DIAGNOSIS — J301 Allergic rhinitis due to pollen: Secondary | ICD-10-CM | POA: Diagnosis not present

## 2018-04-21 DIAGNOSIS — J3089 Other allergic rhinitis: Secondary | ICD-10-CM | POA: Diagnosis not present

## 2018-04-28 DIAGNOSIS — J3089 Other allergic rhinitis: Secondary | ICD-10-CM | POA: Diagnosis not present

## 2018-04-28 DIAGNOSIS — J301 Allergic rhinitis due to pollen: Secondary | ICD-10-CM | POA: Diagnosis not present

## 2018-04-28 DIAGNOSIS — J3081 Allergic rhinitis due to animal (cat) (dog) hair and dander: Secondary | ICD-10-CM | POA: Diagnosis not present

## 2018-05-06 DIAGNOSIS — H5203 Hypermetropia, bilateral: Secondary | ICD-10-CM | POA: Diagnosis not present

## 2018-05-06 DIAGNOSIS — H524 Presbyopia: Secondary | ICD-10-CM | POA: Diagnosis not present

## 2018-05-06 DIAGNOSIS — H52202 Unspecified astigmatism, left eye: Secondary | ICD-10-CM | POA: Diagnosis not present

## 2018-05-06 DIAGNOSIS — H2513 Age-related nuclear cataract, bilateral: Secondary | ICD-10-CM | POA: Diagnosis not present

## 2018-05-12 DIAGNOSIS — J3081 Allergic rhinitis due to animal (cat) (dog) hair and dander: Secondary | ICD-10-CM | POA: Diagnosis not present

## 2018-05-12 DIAGNOSIS — J301 Allergic rhinitis due to pollen: Secondary | ICD-10-CM | POA: Diagnosis not present

## 2018-05-12 DIAGNOSIS — J3089 Other allergic rhinitis: Secondary | ICD-10-CM | POA: Diagnosis not present

## 2018-05-13 DIAGNOSIS — J301 Allergic rhinitis due to pollen: Secondary | ICD-10-CM | POA: Diagnosis not present

## 2018-05-23 ENCOUNTER — Other Ambulatory Visit (HOSPITAL_COMMUNITY): Payer: Self-pay | Admitting: Internal Medicine

## 2018-05-23 DIAGNOSIS — Z1231 Encounter for screening mammogram for malignant neoplasm of breast: Secondary | ICD-10-CM

## 2018-05-24 DIAGNOSIS — J3081 Allergic rhinitis due to animal (cat) (dog) hair and dander: Secondary | ICD-10-CM | POA: Diagnosis not present

## 2018-05-24 DIAGNOSIS — J3089 Other allergic rhinitis: Secondary | ICD-10-CM | POA: Diagnosis not present

## 2018-05-24 DIAGNOSIS — J301 Allergic rhinitis due to pollen: Secondary | ICD-10-CM | POA: Diagnosis not present

## 2018-06-09 DIAGNOSIS — J3089 Other allergic rhinitis: Secondary | ICD-10-CM | POA: Diagnosis not present

## 2018-06-09 DIAGNOSIS — J301 Allergic rhinitis due to pollen: Secondary | ICD-10-CM | POA: Diagnosis not present

## 2018-06-14 DIAGNOSIS — L280 Lichen simplex chronicus: Secondary | ICD-10-CM | POA: Diagnosis not present

## 2018-06-14 DIAGNOSIS — L570 Actinic keratosis: Secondary | ICD-10-CM | POA: Diagnosis not present

## 2018-06-14 DIAGNOSIS — L400 Psoriasis vulgaris: Secondary | ICD-10-CM | POA: Diagnosis not present

## 2018-06-14 DIAGNOSIS — L821 Other seborrheic keratosis: Secondary | ICD-10-CM | POA: Diagnosis not present

## 2018-06-14 DIAGNOSIS — D485 Neoplasm of uncertain behavior of skin: Secondary | ICD-10-CM | POA: Diagnosis not present

## 2018-06-16 DIAGNOSIS — J3089 Other allergic rhinitis: Secondary | ICD-10-CM | POA: Diagnosis not present

## 2018-06-16 DIAGNOSIS — J301 Allergic rhinitis due to pollen: Secondary | ICD-10-CM | POA: Diagnosis not present

## 2018-06-30 DIAGNOSIS — L280 Lichen simplex chronicus: Secondary | ICD-10-CM | POA: Diagnosis not present

## 2018-06-30 DIAGNOSIS — L570 Actinic keratosis: Secondary | ICD-10-CM | POA: Diagnosis not present

## 2018-07-01 DIAGNOSIS — J3081 Allergic rhinitis due to animal (cat) (dog) hair and dander: Secondary | ICD-10-CM | POA: Diagnosis not present

## 2018-07-01 DIAGNOSIS — J3089 Other allergic rhinitis: Secondary | ICD-10-CM | POA: Diagnosis not present

## 2018-07-01 DIAGNOSIS — J301 Allergic rhinitis due to pollen: Secondary | ICD-10-CM | POA: Diagnosis not present

## 2018-07-06 ENCOUNTER — Ambulatory Visit (HOSPITAL_COMMUNITY): Payer: Medicare Other

## 2018-07-11 ENCOUNTER — Encounter (HOSPITAL_COMMUNITY): Payer: Self-pay

## 2018-07-11 ENCOUNTER — Other Ambulatory Visit (HOSPITAL_COMMUNITY): Payer: Self-pay | Admitting: Gynecology

## 2018-07-11 ENCOUNTER — Ambulatory Visit (HOSPITAL_COMMUNITY)
Admission: RE | Admit: 2018-07-11 | Discharge: 2018-07-11 | Disposition: A | Discharge status: Home / Self Care | Payer: Medicare Other | Source: Ambulatory Visit | Attending: Internal Medicine | Admitting: Internal Medicine

## 2018-07-11 DIAGNOSIS — Z1231 Encounter for screening mammogram for malignant neoplasm of breast: Secondary | ICD-10-CM | POA: Insufficient documentation

## 2018-07-13 DIAGNOSIS — J3081 Allergic rhinitis due to animal (cat) (dog) hair and dander: Secondary | ICD-10-CM | POA: Diagnosis not present

## 2018-07-13 DIAGNOSIS — J3089 Other allergic rhinitis: Secondary | ICD-10-CM | POA: Diagnosis not present

## 2018-07-13 DIAGNOSIS — J301 Allergic rhinitis due to pollen: Secondary | ICD-10-CM | POA: Diagnosis not present

## 2018-08-04 DIAGNOSIS — J3089 Other allergic rhinitis: Secondary | ICD-10-CM | POA: Diagnosis not present

## 2018-08-04 DIAGNOSIS — J301 Allergic rhinitis due to pollen: Secondary | ICD-10-CM | POA: Diagnosis not present

## 2018-08-11 DIAGNOSIS — J3081 Allergic rhinitis due to animal (cat) (dog) hair and dander: Secondary | ICD-10-CM | POA: Diagnosis not present

## 2018-08-11 DIAGNOSIS — J301 Allergic rhinitis due to pollen: Secondary | ICD-10-CM | POA: Diagnosis not present

## 2018-08-11 DIAGNOSIS — J3089 Other allergic rhinitis: Secondary | ICD-10-CM | POA: Diagnosis not present

## 2018-08-15 DIAGNOSIS — Z0001 Encounter for general adult medical examination with abnormal findings: Secondary | ICD-10-CM | POA: Diagnosis not present

## 2018-08-15 DIAGNOSIS — Z6833 Body mass index (BMI) 33.0-33.9, adult: Secondary | ICD-10-CM | POA: Diagnosis not present

## 2018-08-15 DIAGNOSIS — Z1389 Encounter for screening for other disorder: Secondary | ICD-10-CM | POA: Diagnosis not present

## 2018-08-15 DIAGNOSIS — E6609 Other obesity due to excess calories: Secondary | ICD-10-CM | POA: Diagnosis not present

## 2018-08-18 DIAGNOSIS — J3089 Other allergic rhinitis: Secondary | ICD-10-CM | POA: Diagnosis not present

## 2018-08-18 DIAGNOSIS — J3081 Allergic rhinitis due to animal (cat) (dog) hair and dander: Secondary | ICD-10-CM | POA: Diagnosis not present

## 2018-08-18 DIAGNOSIS — J301 Allergic rhinitis due to pollen: Secondary | ICD-10-CM | POA: Diagnosis not present

## 2018-08-24 DIAGNOSIS — L219 Seborrheic dermatitis, unspecified: Secondary | ICD-10-CM | POA: Diagnosis not present

## 2018-08-24 DIAGNOSIS — L409 Psoriasis, unspecified: Secondary | ICD-10-CM | POA: Diagnosis not present

## 2018-08-25 DIAGNOSIS — J3081 Allergic rhinitis due to animal (cat) (dog) hair and dander: Secondary | ICD-10-CM | POA: Diagnosis not present

## 2018-08-25 DIAGNOSIS — J301 Allergic rhinitis due to pollen: Secondary | ICD-10-CM | POA: Diagnosis not present

## 2018-08-25 DIAGNOSIS — J3089 Other allergic rhinitis: Secondary | ICD-10-CM | POA: Diagnosis not present

## 2018-09-01 DIAGNOSIS — J301 Allergic rhinitis due to pollen: Secondary | ICD-10-CM | POA: Diagnosis not present

## 2018-09-01 DIAGNOSIS — J3089 Other allergic rhinitis: Secondary | ICD-10-CM | POA: Diagnosis not present

## 2018-09-08 DIAGNOSIS — J3081 Allergic rhinitis due to animal (cat) (dog) hair and dander: Secondary | ICD-10-CM | POA: Diagnosis not present

## 2018-09-08 DIAGNOSIS — J301 Allergic rhinitis due to pollen: Secondary | ICD-10-CM | POA: Diagnosis not present

## 2018-09-08 DIAGNOSIS — J3089 Other allergic rhinitis: Secondary | ICD-10-CM | POA: Diagnosis not present

## 2018-09-09 DIAGNOSIS — J3081 Allergic rhinitis due to animal (cat) (dog) hair and dander: Secondary | ICD-10-CM | POA: Diagnosis not present

## 2018-09-09 DIAGNOSIS — J3089 Other allergic rhinitis: Secondary | ICD-10-CM | POA: Diagnosis not present

## 2018-09-22 DIAGNOSIS — J3081 Allergic rhinitis due to animal (cat) (dog) hair and dander: Secondary | ICD-10-CM | POA: Diagnosis not present

## 2018-09-22 DIAGNOSIS — J301 Allergic rhinitis due to pollen: Secondary | ICD-10-CM | POA: Diagnosis not present

## 2018-09-22 DIAGNOSIS — J3089 Other allergic rhinitis: Secondary | ICD-10-CM | POA: Diagnosis not present

## 2018-09-29 DIAGNOSIS — J3089 Other allergic rhinitis: Secondary | ICD-10-CM | POA: Diagnosis not present

## 2018-09-29 DIAGNOSIS — J301 Allergic rhinitis due to pollen: Secondary | ICD-10-CM | POA: Diagnosis not present

## 2018-09-29 DIAGNOSIS — J3081 Allergic rhinitis due to animal (cat) (dog) hair and dander: Secondary | ICD-10-CM | POA: Diagnosis not present

## 2018-10-06 DIAGNOSIS — J3081 Allergic rhinitis due to animal (cat) (dog) hair and dander: Secondary | ICD-10-CM | POA: Diagnosis not present

## 2018-10-06 DIAGNOSIS — J3089 Other allergic rhinitis: Secondary | ICD-10-CM | POA: Diagnosis not present

## 2018-10-06 DIAGNOSIS — J301 Allergic rhinitis due to pollen: Secondary | ICD-10-CM | POA: Diagnosis not present

## 2018-10-20 DIAGNOSIS — J3081 Allergic rhinitis due to animal (cat) (dog) hair and dander: Secondary | ICD-10-CM | POA: Diagnosis not present

## 2018-10-20 DIAGNOSIS — J3089 Other allergic rhinitis: Secondary | ICD-10-CM | POA: Diagnosis not present

## 2018-10-20 DIAGNOSIS — J301 Allergic rhinitis due to pollen: Secondary | ICD-10-CM | POA: Diagnosis not present

## 2018-11-03 DIAGNOSIS — J3089 Other allergic rhinitis: Secondary | ICD-10-CM | POA: Diagnosis not present

## 2018-11-03 DIAGNOSIS — J3081 Allergic rhinitis due to animal (cat) (dog) hair and dander: Secondary | ICD-10-CM | POA: Diagnosis not present

## 2018-11-03 DIAGNOSIS — J301 Allergic rhinitis due to pollen: Secondary | ICD-10-CM | POA: Diagnosis not present

## 2018-11-16 DIAGNOSIS — J3089 Other allergic rhinitis: Secondary | ICD-10-CM | POA: Diagnosis not present

## 2018-11-16 DIAGNOSIS — J301 Allergic rhinitis due to pollen: Secondary | ICD-10-CM | POA: Diagnosis not present

## 2018-11-16 DIAGNOSIS — J3081 Allergic rhinitis due to animal (cat) (dog) hair and dander: Secondary | ICD-10-CM | POA: Diagnosis not present

## 2018-11-23 DIAGNOSIS — J301 Allergic rhinitis due to pollen: Secondary | ICD-10-CM | POA: Diagnosis not present

## 2018-11-23 DIAGNOSIS — J3081 Allergic rhinitis due to animal (cat) (dog) hair and dander: Secondary | ICD-10-CM | POA: Diagnosis not present

## 2018-11-23 DIAGNOSIS — J3089 Other allergic rhinitis: Secondary | ICD-10-CM | POA: Diagnosis not present

## 2018-11-28 DIAGNOSIS — L219 Seborrheic dermatitis, unspecified: Secondary | ICD-10-CM | POA: Diagnosis not present

## 2018-11-28 DIAGNOSIS — L280 Lichen simplex chronicus: Secondary | ICD-10-CM | POA: Diagnosis not present

## 2018-11-28 DIAGNOSIS — L409 Psoriasis, unspecified: Secondary | ICD-10-CM | POA: Diagnosis not present

## 2018-11-28 DIAGNOSIS — L570 Actinic keratosis: Secondary | ICD-10-CM | POA: Diagnosis not present

## 2018-11-30 DIAGNOSIS — J3089 Other allergic rhinitis: Secondary | ICD-10-CM | POA: Diagnosis not present

## 2018-11-30 DIAGNOSIS — J3081 Allergic rhinitis due to animal (cat) (dog) hair and dander: Secondary | ICD-10-CM | POA: Diagnosis not present

## 2018-11-30 DIAGNOSIS — J301 Allergic rhinitis due to pollen: Secondary | ICD-10-CM | POA: Diagnosis not present

## 2018-12-05 DIAGNOSIS — J3089 Other allergic rhinitis: Secondary | ICD-10-CM | POA: Diagnosis not present

## 2018-12-05 DIAGNOSIS — J301 Allergic rhinitis due to pollen: Secondary | ICD-10-CM | POA: Diagnosis not present

## 2018-12-05 DIAGNOSIS — J3081 Allergic rhinitis due to animal (cat) (dog) hair and dander: Secondary | ICD-10-CM | POA: Diagnosis not present

## 2018-12-15 DIAGNOSIS — J3089 Other allergic rhinitis: Secondary | ICD-10-CM | POA: Diagnosis not present

## 2018-12-15 DIAGNOSIS — J301 Allergic rhinitis due to pollen: Secondary | ICD-10-CM | POA: Diagnosis not present

## 2018-12-15 DIAGNOSIS — J3081 Allergic rhinitis due to animal (cat) (dog) hair and dander: Secondary | ICD-10-CM | POA: Diagnosis not present

## 2018-12-27 DIAGNOSIS — J3081 Allergic rhinitis due to animal (cat) (dog) hair and dander: Secondary | ICD-10-CM | POA: Diagnosis not present

## 2018-12-27 DIAGNOSIS — J3089 Other allergic rhinitis: Secondary | ICD-10-CM | POA: Diagnosis not present

## 2018-12-27 DIAGNOSIS — J301 Allergic rhinitis due to pollen: Secondary | ICD-10-CM | POA: Diagnosis not present

## 2019-01-04 DIAGNOSIS — J3089 Other allergic rhinitis: Secondary | ICD-10-CM | POA: Diagnosis not present

## 2019-01-04 DIAGNOSIS — J3081 Allergic rhinitis due to animal (cat) (dog) hair and dander: Secondary | ICD-10-CM | POA: Diagnosis not present

## 2019-01-04 DIAGNOSIS — J301 Allergic rhinitis due to pollen: Secondary | ICD-10-CM | POA: Diagnosis not present

## 2019-01-12 DIAGNOSIS — J3081 Allergic rhinitis due to animal (cat) (dog) hair and dander: Secondary | ICD-10-CM | POA: Diagnosis not present

## 2019-01-12 DIAGNOSIS — J301 Allergic rhinitis due to pollen: Secondary | ICD-10-CM | POA: Diagnosis not present

## 2019-01-12 DIAGNOSIS — J3089 Other allergic rhinitis: Secondary | ICD-10-CM | POA: Diagnosis not present

## 2019-01-25 DIAGNOSIS — J3089 Other allergic rhinitis: Secondary | ICD-10-CM | POA: Diagnosis not present

## 2019-01-25 DIAGNOSIS — J301 Allergic rhinitis due to pollen: Secondary | ICD-10-CM | POA: Diagnosis not present

## 2019-01-25 DIAGNOSIS — J3081 Allergic rhinitis due to animal (cat) (dog) hair and dander: Secondary | ICD-10-CM | POA: Diagnosis not present

## 2019-01-26 DIAGNOSIS — J301 Allergic rhinitis due to pollen: Secondary | ICD-10-CM | POA: Diagnosis not present

## 2019-02-09 DIAGNOSIS — J301 Allergic rhinitis due to pollen: Secondary | ICD-10-CM | POA: Diagnosis not present

## 2019-02-09 DIAGNOSIS — J3081 Allergic rhinitis due to animal (cat) (dog) hair and dander: Secondary | ICD-10-CM | POA: Diagnosis not present

## 2019-02-09 DIAGNOSIS — J3089 Other allergic rhinitis: Secondary | ICD-10-CM | POA: Diagnosis not present

## 2019-02-15 DIAGNOSIS — J3081 Allergic rhinitis due to animal (cat) (dog) hair and dander: Secondary | ICD-10-CM | POA: Diagnosis not present

## 2019-02-15 DIAGNOSIS — J3089 Other allergic rhinitis: Secondary | ICD-10-CM | POA: Diagnosis not present

## 2019-02-15 DIAGNOSIS — J301 Allergic rhinitis due to pollen: Secondary | ICD-10-CM | POA: Diagnosis not present

## 2019-02-22 DIAGNOSIS — J3089 Other allergic rhinitis: Secondary | ICD-10-CM | POA: Diagnosis not present

## 2019-02-22 DIAGNOSIS — J301 Allergic rhinitis due to pollen: Secondary | ICD-10-CM | POA: Diagnosis not present

## 2019-02-22 DIAGNOSIS — J3081 Allergic rhinitis due to animal (cat) (dog) hair and dander: Secondary | ICD-10-CM | POA: Diagnosis not present

## 2019-03-01 DIAGNOSIS — J3089 Other allergic rhinitis: Secondary | ICD-10-CM | POA: Diagnosis not present

## 2019-03-01 DIAGNOSIS — J301 Allergic rhinitis due to pollen: Secondary | ICD-10-CM | POA: Diagnosis not present

## 2019-03-01 DIAGNOSIS — J3081 Allergic rhinitis due to animal (cat) (dog) hair and dander: Secondary | ICD-10-CM | POA: Diagnosis not present

## 2019-03-16 DIAGNOSIS — J3089 Other allergic rhinitis: Secondary | ICD-10-CM | POA: Diagnosis not present

## 2019-03-16 DIAGNOSIS — J301 Allergic rhinitis due to pollen: Secondary | ICD-10-CM | POA: Diagnosis not present

## 2019-03-16 DIAGNOSIS — J3081 Allergic rhinitis due to animal (cat) (dog) hair and dander: Secondary | ICD-10-CM | POA: Diagnosis not present

## 2019-03-22 DIAGNOSIS — J3081 Allergic rhinitis due to animal (cat) (dog) hair and dander: Secondary | ICD-10-CM | POA: Diagnosis not present

## 2019-03-22 DIAGNOSIS — J301 Allergic rhinitis due to pollen: Secondary | ICD-10-CM | POA: Diagnosis not present

## 2019-03-22 DIAGNOSIS — J3089 Other allergic rhinitis: Secondary | ICD-10-CM | POA: Diagnosis not present

## 2019-04-05 DIAGNOSIS — J3089 Other allergic rhinitis: Secondary | ICD-10-CM | POA: Diagnosis not present

## 2019-04-05 DIAGNOSIS — J301 Allergic rhinitis due to pollen: Secondary | ICD-10-CM | POA: Diagnosis not present

## 2019-04-05 DIAGNOSIS — J3081 Allergic rhinitis due to animal (cat) (dog) hair and dander: Secondary | ICD-10-CM | POA: Diagnosis not present

## 2019-04-06 DIAGNOSIS — L409 Psoriasis, unspecified: Secondary | ICD-10-CM | POA: Diagnosis not present

## 2019-04-06 DIAGNOSIS — L280 Lichen simplex chronicus: Secondary | ICD-10-CM | POA: Diagnosis not present

## 2019-04-06 DIAGNOSIS — L219 Seborrheic dermatitis, unspecified: Secondary | ICD-10-CM | POA: Diagnosis not present

## 2019-04-12 DIAGNOSIS — J3089 Other allergic rhinitis: Secondary | ICD-10-CM | POA: Diagnosis not present

## 2019-04-12 DIAGNOSIS — J3081 Allergic rhinitis due to animal (cat) (dog) hair and dander: Secondary | ICD-10-CM | POA: Diagnosis not present

## 2019-04-12 DIAGNOSIS — J301 Allergic rhinitis due to pollen: Secondary | ICD-10-CM | POA: Diagnosis not present

## 2019-04-17 ENCOUNTER — Other Ambulatory Visit: Payer: Self-pay

## 2019-04-17 DIAGNOSIS — Z20822 Contact with and (suspected) exposure to covid-19: Secondary | ICD-10-CM

## 2019-04-19 LAB — NOVEL CORONAVIRUS, NAA: SARS-CoV-2, NAA: NOT DETECTED

## 2019-04-24 DIAGNOSIS — J3081 Allergic rhinitis due to animal (cat) (dog) hair and dander: Secondary | ICD-10-CM | POA: Diagnosis not present

## 2019-04-24 DIAGNOSIS — J3089 Other allergic rhinitis: Secondary | ICD-10-CM | POA: Diagnosis not present

## 2019-05-02 DIAGNOSIS — J301 Allergic rhinitis due to pollen: Secondary | ICD-10-CM | POA: Diagnosis not present

## 2019-05-02 DIAGNOSIS — J3081 Allergic rhinitis due to animal (cat) (dog) hair and dander: Secondary | ICD-10-CM | POA: Diagnosis not present

## 2019-05-02 DIAGNOSIS — J3089 Other allergic rhinitis: Secondary | ICD-10-CM | POA: Diagnosis not present

## 2019-05-08 DIAGNOSIS — J3081 Allergic rhinitis due to animal (cat) (dog) hair and dander: Secondary | ICD-10-CM | POA: Diagnosis not present

## 2019-05-08 DIAGNOSIS — J301 Allergic rhinitis due to pollen: Secondary | ICD-10-CM | POA: Diagnosis not present

## 2019-05-08 DIAGNOSIS — H1045 Other chronic allergic conjunctivitis: Secondary | ICD-10-CM | POA: Diagnosis not present

## 2019-05-08 DIAGNOSIS — R05 Cough: Secondary | ICD-10-CM | POA: Diagnosis not present

## 2019-05-08 DIAGNOSIS — J3089 Other allergic rhinitis: Secondary | ICD-10-CM | POA: Diagnosis not present

## 2019-05-09 DIAGNOSIS — H2513 Age-related nuclear cataract, bilateral: Secondary | ICD-10-CM | POA: Diagnosis not present

## 2019-05-09 DIAGNOSIS — Z23 Encounter for immunization: Secondary | ICD-10-CM | POA: Diagnosis not present

## 2019-05-23 DIAGNOSIS — J3081 Allergic rhinitis due to animal (cat) (dog) hair and dander: Secondary | ICD-10-CM | POA: Diagnosis not present

## 2019-05-23 DIAGNOSIS — J3089 Other allergic rhinitis: Secondary | ICD-10-CM | POA: Diagnosis not present

## 2019-05-23 DIAGNOSIS — J301 Allergic rhinitis due to pollen: Secondary | ICD-10-CM | POA: Diagnosis not present

## 2019-06-13 ENCOUNTER — Other Ambulatory Visit: Payer: Self-pay

## 2019-06-13 ENCOUNTER — Ambulatory Visit: Payer: Medicare Other | Attending: Internal Medicine

## 2019-06-13 DIAGNOSIS — Z20822 Contact with and (suspected) exposure to covid-19: Secondary | ICD-10-CM

## 2019-06-14 LAB — NOVEL CORONAVIRUS, NAA: SARS-CoV-2, NAA: NOT DETECTED

## 2019-07-12 ENCOUNTER — Other Ambulatory Visit (HOSPITAL_COMMUNITY): Payer: Self-pay | Admitting: Internal Medicine

## 2019-07-12 DIAGNOSIS — Z1231 Encounter for screening mammogram for malignant neoplasm of breast: Secondary | ICD-10-CM

## 2019-07-21 ENCOUNTER — Other Ambulatory Visit: Payer: Self-pay

## 2019-07-21 ENCOUNTER — Ambulatory Visit (HOSPITAL_COMMUNITY)
Admission: RE | Admit: 2019-07-21 | Discharge: 2019-07-21 | Disposition: A | Discharge status: Home / Self Care | Payer: Medicare Other | Source: Ambulatory Visit | Attending: Internal Medicine | Admitting: Internal Medicine

## 2019-07-21 DIAGNOSIS — Z1231 Encounter for screening mammogram for malignant neoplasm of breast: Secondary | ICD-10-CM | POA: Diagnosis present

## 2020-06-04 DIAGNOSIS — Z23 Encounter for immunization: Secondary | ICD-10-CM | POA: Diagnosis not present

## 2020-06-27 ENCOUNTER — Other Ambulatory Visit (HOSPITAL_COMMUNITY): Payer: Self-pay | Admitting: Family Medicine

## 2020-06-27 DIAGNOSIS — Z1231 Encounter for screening mammogram for malignant neoplasm of breast: Secondary | ICD-10-CM

## 2020-07-22 ENCOUNTER — Other Ambulatory Visit: Payer: Self-pay

## 2020-07-22 ENCOUNTER — Ambulatory Visit (HOSPITAL_COMMUNITY)
Admission: RE | Admit: 2020-07-22 | Discharge: 2020-07-22 | Disposition: A | Discharge status: Home / Self Care | Payer: Medicare Other | Source: Ambulatory Visit | Attending: Family Medicine | Admitting: Family Medicine

## 2020-07-22 DIAGNOSIS — J3081 Allergic rhinitis due to animal (cat) (dog) hair and dander: Secondary | ICD-10-CM | POA: Diagnosis not present

## 2020-07-22 DIAGNOSIS — Z1231 Encounter for screening mammogram for malignant neoplasm of breast: Secondary | ICD-10-CM | POA: Diagnosis not present

## 2020-07-22 DIAGNOSIS — J3089 Other allergic rhinitis: Secondary | ICD-10-CM | POA: Diagnosis not present

## 2020-07-22 DIAGNOSIS — J301 Allergic rhinitis due to pollen: Secondary | ICD-10-CM | POA: Diagnosis not present

## 2020-07-30 DIAGNOSIS — J3089 Other allergic rhinitis: Secondary | ICD-10-CM | POA: Diagnosis not present

## 2020-07-30 DIAGNOSIS — J3081 Allergic rhinitis due to animal (cat) (dog) hair and dander: Secondary | ICD-10-CM | POA: Diagnosis not present

## 2020-07-30 DIAGNOSIS — J301 Allergic rhinitis due to pollen: Secondary | ICD-10-CM | POA: Diagnosis not present

## 2020-08-06 DIAGNOSIS — J3089 Other allergic rhinitis: Secondary | ICD-10-CM | POA: Diagnosis not present

## 2020-08-06 DIAGNOSIS — J301 Allergic rhinitis due to pollen: Secondary | ICD-10-CM | POA: Diagnosis not present

## 2020-08-06 DIAGNOSIS — J3081 Allergic rhinitis due to animal (cat) (dog) hair and dander: Secondary | ICD-10-CM | POA: Diagnosis not present

## 2020-08-11 DIAGNOSIS — Z23 Encounter for immunization: Secondary | ICD-10-CM | POA: Diagnosis not present

## 2020-08-15 DIAGNOSIS — J301 Allergic rhinitis due to pollen: Secondary | ICD-10-CM | POA: Diagnosis not present

## 2020-08-15 DIAGNOSIS — J3081 Allergic rhinitis due to animal (cat) (dog) hair and dander: Secondary | ICD-10-CM | POA: Diagnosis not present

## 2020-08-15 DIAGNOSIS — J3089 Other allergic rhinitis: Secondary | ICD-10-CM | POA: Diagnosis not present

## 2020-08-20 DIAGNOSIS — J3089 Other allergic rhinitis: Secondary | ICD-10-CM | POA: Diagnosis not present

## 2020-08-20 DIAGNOSIS — J301 Allergic rhinitis due to pollen: Secondary | ICD-10-CM | POA: Diagnosis not present

## 2020-08-20 DIAGNOSIS — J3081 Allergic rhinitis due to animal (cat) (dog) hair and dander: Secondary | ICD-10-CM | POA: Diagnosis not present

## 2020-08-26 DIAGNOSIS — J3081 Allergic rhinitis due to animal (cat) (dog) hair and dander: Secondary | ICD-10-CM | POA: Diagnosis not present

## 2020-08-26 DIAGNOSIS — J301 Allergic rhinitis due to pollen: Secondary | ICD-10-CM | POA: Diagnosis not present

## 2020-08-26 DIAGNOSIS — J3089 Other allergic rhinitis: Secondary | ICD-10-CM | POA: Diagnosis not present

## 2020-09-05 DIAGNOSIS — J301 Allergic rhinitis due to pollen: Secondary | ICD-10-CM | POA: Diagnosis not present

## 2020-09-05 DIAGNOSIS — J3089 Other allergic rhinitis: Secondary | ICD-10-CM | POA: Diagnosis not present

## 2020-09-05 DIAGNOSIS — J3081 Allergic rhinitis due to animal (cat) (dog) hair and dander: Secondary | ICD-10-CM | POA: Diagnosis not present

## 2020-09-10 DIAGNOSIS — J3089 Other allergic rhinitis: Secondary | ICD-10-CM | POA: Diagnosis not present

## 2020-09-10 DIAGNOSIS — J301 Allergic rhinitis due to pollen: Secondary | ICD-10-CM | POA: Diagnosis not present

## 2020-09-10 DIAGNOSIS — J3081 Allergic rhinitis due to animal (cat) (dog) hair and dander: Secondary | ICD-10-CM | POA: Diagnosis not present

## 2020-09-18 ENCOUNTER — Emergency Department (HOSPITAL_COMMUNITY): Payer: Medicare Other

## 2020-09-18 ENCOUNTER — Encounter (HOSPITAL_COMMUNITY): Payer: Self-pay

## 2020-09-18 ENCOUNTER — Other Ambulatory Visit: Payer: Self-pay

## 2020-09-18 ENCOUNTER — Emergency Department (HOSPITAL_COMMUNITY)
Admission: EM | Admit: 2020-09-18 | Discharge: 2020-09-19 | Disposition: A | Discharge status: Home / Self Care | Payer: Medicare Other | Attending: Emergency Medicine | Admitting: Emergency Medicine

## 2020-09-18 DIAGNOSIS — R630 Anorexia: Secondary | ICD-10-CM | POA: Insufficient documentation

## 2020-09-18 DIAGNOSIS — K529 Noninfective gastroenteritis and colitis, unspecified: Secondary | ICD-10-CM | POA: Diagnosis not present

## 2020-09-18 DIAGNOSIS — R1031 Right lower quadrant pain: Secondary | ICD-10-CM | POA: Diagnosis not present

## 2020-09-18 DIAGNOSIS — R109 Unspecified abdominal pain: Secondary | ICD-10-CM | POA: Diagnosis not present

## 2020-09-18 LAB — URINALYSIS, ROUTINE W REFLEX MICROSCOPIC
Bilirubin Urine: NEGATIVE
Glucose, UA: NEGATIVE mg/dL
Hgb urine dipstick: NEGATIVE
Ketones, ur: NEGATIVE mg/dL
Leukocytes,Ua: NEGATIVE
Nitrite: NEGATIVE
Protein, ur: NEGATIVE mg/dL
Specific Gravity, Urine: 1.011 (ref 1.005–1.030)
pH: 6 (ref 5.0–8.0)

## 2020-09-18 LAB — COMPREHENSIVE METABOLIC PANEL
ALT: 16 U/L (ref 0–44)
AST: 16 U/L (ref 15–41)
Albumin: 4.1 g/dL (ref 3.5–5.0)
Alkaline Phosphatase: 94 U/L (ref 38–126)
Anion gap: 9 (ref 5–15)
BUN: 17 mg/dL (ref 8–23)
CO2: 26 mmol/L (ref 22–32)
Calcium: 9.6 mg/dL (ref 8.9–10.3)
Chloride: 102 mmol/L (ref 98–111)
Creatinine, Ser: 0.7 mg/dL (ref 0.44–1.00)
GFR, Estimated: >60 mL/min (ref >60)
Glucose, Bld: 119 mg/dL — ABNORMAL HIGH (ref 70–99)
Potassium: 4.1 mmol/L (ref 3.5–5.1)
Sodium: 137 mmol/L (ref 135–145)
Total Bilirubin: 0.4 mg/dL (ref 0.3–1.2)
Total Protein: 7.7 g/dL (ref 6.5–8.1)

## 2020-09-18 LAB — CBC WITH DIFFERENTIAL/PLATELET
Abs Immature Granulocytes: 0.01 10*3/uL (ref 0.00–0.07)
Basophils Absolute: 0.1 10*3/uL (ref 0.0–0.1)
Basophils Relative: 1 %
Eosinophils Absolute: 0.2 10*3/uL (ref 0.0–0.5)
Eosinophils Relative: 2 %
HCT: 38.4 % (ref 36.0–46.0)
Hemoglobin: 12.1 g/dL (ref 12.0–15.0)
Immature Granulocytes: 0 %
Lymphocytes Relative: 23 %
Lymphs Abs: 2 10*3/uL (ref 0.7–4.0)
MCH: 28.1 pg (ref 26.0–34.0)
MCHC: 31.5 g/dL (ref 30.0–36.0)
MCV: 89.3 fL (ref 80.0–100.0)
Monocytes Absolute: 0.6 10*3/uL (ref 0.1–1.0)
Monocytes Relative: 7 %
Neutro Abs: 5.9 10*3/uL (ref 1.7–7.7)
Neutrophils Relative %: 67 %
Platelets: 303 10*3/uL (ref 150–400)
RBC: 4.3 MIL/uL (ref 3.87–5.11)
RDW: 15.2 % (ref 11.5–15.5)
WBC: 8.6 10*3/uL (ref 4.0–10.5)
nRBC: 0 % (ref 0.0–0.2)

## 2020-09-18 MED ORDER — MORPHINE SULFATE (PF) 4 MG/ML IV SOLN
4.0000 mg | Freq: Once | INTRAVENOUS | Status: AC | PRN
  Administered 2020-09-18: 4 mg via INTRAVENOUS
  Filled 2020-09-18: qty 1

## 2020-09-18 MED ORDER — IOHEXOL 300 MG/ML  SOLN
100.0000 mL | Freq: Once | INTRAMUSCULAR | Status: AC | PRN
  Administered 2020-09-18: 100 mL via INTRAVENOUS

## 2020-09-18 MED ORDER — LACTATED RINGERS IV BOLUS
1000.0000 mL | Freq: Once | INTRAVENOUS | Status: AC
  Administered 2020-09-18: 1000 mL via INTRAVENOUS

## 2020-09-18 NOTE — ED Provider Notes (Signed)
[facility] EMERGENCY DEPARTMENT Provider Note   CSN: [id number] Arrival date & time: 09/18/20  2104     History Chief Complaint  Patient presents with  . Abdominal Pain    Dana Macdonald is a 75 y.o. female.  HPI     75 year old female comes in a chief complaint of abdominal pain. She has no significant medical history and reports tubal ligation history in the remote past.  Patient reports that she started having some abdominal discomfort this afternoon.  Initially she felt bloated and had an urge to go defecate.  The pain has been constant, has gotten a little intense.  The pain is still rated about 5 or 6 out of 10.  She has noted some nodule in her right lower abdomen and that area is sore.  Patient has no history of similar symptoms.  She has anorexia but denies any nausea, vomiting, fevers, chills.  She has had any UTIs long time ago, and some of the discomfort does remind her of UTI.   Past Medical History:  Diagnosis Date  . Seasonal allergies   . Spinal stenosis     Patient Active Problem List   Diagnosis Date Noted  . Abdominal pain, acute, right lower quadrant 07/07/2012    Past Surgical History:  Procedure Laterality Date  . COLONOSCOPY N/A 08/25/2012   Procedure: COLONOSCOPY;  Surgeon: Rogene Houston, MD;  Location: [facility];  Service: Endoscopy;  Laterality: N/A;  830  . Torn achilles     bilaterally in the 90s  . TUBAL LIGATION     early 8s     OB History   No obstetric history on file.     No family history on file.  Social History   Tobacco Use  . Smoking status: Never Smoker  Substance Use Topics  . Alcohol use: Yes    Comment: social  . Drug use: No    Home Medications Prior to Admission medications   Medication Sig Start Date End Date Taking? Authorizing Provider  HYDROcodone-acetaminophen (NORCO/VICODIN) 5-325 MG per tablet Take 2 tablets by mouth every 4 (four) hours as needed for pain. 06/28/12   Malvin Johns, MD   naproxen sodium (ANAPROX) 220 MG tablet Take 220 mg by mouth 2 (two) times daily as needed.    [provider]    Allergies    Patient has no known allergies.  Review of Systems   Review of Systems  Constitutional: Positive for activity change.  Respiratory: Negative for shortness of breath.   Cardiovascular: Negative for chest pain.  Gastrointestinal: Positive for abdominal pain. Negative for nausea and vomiting.  Genitourinary: Negative for dysuria.  All other systems reviewed and are negative.   Physical Exam Updated Vital Signs BP (!) 173/99   Pulse 87   Temp 97.9 F (36.6 C) (Oral)   Resp 18   Wt 90.7 kg   SpO2 98%   BMI 30.41 kg/m   Physical Exam Vitals and nursing note reviewed.  Constitutional:      Appearance: She is well-developed.  HENT:     Head: Normocephalic and atraumatic.  Cardiovascular:     Rate and Rhythm: Normal rate.  Pulmonary:     Effort: Pulmonary effort is normal.  Abdominal:     General: Bowel sounds are normal.     Tenderness: There is abdominal tenderness in the right lower quadrant. There is guarding. Positive signs include McBurney's sign.  Musculoskeletal:     Cervical back: Normal range  of motion and neck supple.  Skin:    General: Skin is warm and dry.  Neurological:     Mental Status: She is alert and oriented to person, place, and time.     ED Results / Procedures / Treatments   Labs (all labs ordered are listed, but only abnormal results are displayed) Labs Reviewed  CBC WITH DIFFERENTIAL/PLATELET  COMPREHENSIVE METABOLIC PANEL  URINALYSIS, ROUTINE W REFLEX MICROSCOPIC    EKG None  Radiology No results found.  Procedures Procedures   Medications Ordered in ED Medications  morphine 4 MG/ML injection 4 mg (4 mg Intravenous Given 09/18/20 2232)  lactated ringers bolus 1,000 mL (1,000 mLs Intravenous New Bag/Given 09/18/20 2231)    ED Course  I have reviewed the triage vital signs and the nursing  notes.  Pertinent labs & imaging results that were available during my care of the patient were reviewed by me and considered in my medical decision making (see chart for details).    MDM Rules/Calculators/A&P                          75 year old female comes in a chief complaint of abdominal pain.  Pain started earlier today and has gotten more intense.  Her pain is primarily located in the lower quadrants and on exam she has right lower quadrant tenderness with positive McBurney's.  No abdominal surgical history.  No history of kidney stones.   Differential diagnosis includes acute appendicitis, colitis, diverticulitis, perforation, cystitis.  Given the positive McBurney's, we will get a CT scan of the abdomen to rule out appendicitis.  Labs and urinalysis has been ordered.  Patient's care will be signed out to the incoming team.  Patient made aware of the decision to proceed with CT scan.  Final Clinical Impression(s) / ED Diagnoses Final diagnoses:  None    Rx / DC Orders ED Discharge Orders    None       Varney Biles, MD 09/18/20 2248

## 2020-09-18 NOTE — ED Triage Notes (Signed)
Pt reports RLQ pain that started this afternoon. Pt also reports feeling a "knot to RLQ". Reports urinating more frequently. Reports normal BM today.

## 2020-09-18 NOTE — Discharge Instructions (Addendum)
We saw you in the ER for the abdominal pain. All of our results are normal, including all labs and imaging. Kidney function is fine as well. We are not sure what is causing your abdominal pain, and recommend that you see your primary care doctor within 2-3 days for further evaluation. If your symptoms get worse, return to the ER. Take the pain meds and nausea meds as prescribed.

## 2020-09-19 DIAGNOSIS — R1031 Right lower quadrant pain: Secondary | ICD-10-CM | POA: Diagnosis not present

## 2020-09-19 MED ORDER — AMOXICILLIN-POT CLAVULANATE 875-125 MG PO TABS
1.0000 | ORAL_TABLET | Freq: Once | ORAL | Status: AC
  Administered 2020-09-19: 1 via ORAL
  Filled 2020-09-19: qty 1

## 2020-09-19 MED ORDER — HYDROCODONE-ACETAMINOPHEN 5-325 MG PO TABS: 1.0000 | ORAL_TABLET | ORAL | 0 refills | Status: DC | PRN

## 2020-09-19 MED ORDER — DICYCLOMINE HCL 20 MG PO TABS: 20.0000 mg | ORAL_TABLET | Freq: Three times a day (TID) | ORAL | 0 refills | Status: AC

## 2020-09-19 MED ORDER — AMOXICILLIN-POT CLAVULANATE 875-125 MG PO TABS: 1.0000 | ORAL_TABLET | Freq: Two times a day (BID) | ORAL | 0 refills | Status: AC

## 2020-09-19 MED ORDER — DICYCLOMINE HCL 10 MG PO CAPS
10.0000 mg | ORAL_CAPSULE | Freq: Once | ORAL | Status: AC
  Administered 2020-09-19: 10 mg via ORAL
  Filled 2020-09-19: qty 1

## 2020-09-19 NOTE — ED Provider Notes (Signed)
Patient signed out to me by Dr. Kathrynn Humble.  CT scan has been reviewed.  There is inflammation in the area of the terminal ileum consistent with inflammatory or infectious enteritis.  Will empirically prescribe Augmentin.  Discussed admission with the patient.  She reports that she is feeling much better and does not wish to be hospitalized, would prefer to go home.  She has not had any nausea, vomiting.  She reports normal bowel movements through the day yesterday.  Doubt that this is a partial small bowel obstruction, likely ileus.  Patient will therefore be given clear liquid diet instructions and is to return to the ER tomorrow if she has any worsening symptoms.  Otherwise she will follow up with her gastroenterologist, Dr. Laural Golden to schedule outpatient colonoscopy.   Orpah Greek, MD 09/19/20 [id number]

## 2020-09-20 MED FILL — Oxycodone w/ Acetaminophen Tab 5-325 MG: ORAL | Qty: 6 | Status: CN

## 2020-09-20 MED FILL — Hydrocodone-Acetaminophen Tab 5-325 MG: ORAL | Qty: 6 | Status: AC

## 2020-09-23 ENCOUNTER — Encounter (INDEPENDENT_AMBULATORY_CARE_PROVIDER_SITE_OTHER): Payer: Self-pay | Admitting: Gastroenterology

## 2020-09-23 ENCOUNTER — Ambulatory Visit (INDEPENDENT_AMBULATORY_CARE_PROVIDER_SITE_OTHER): Payer: Medicare Other | Admitting: Gastroenterology

## 2020-09-23 ENCOUNTER — Other Ambulatory Visit: Payer: Self-pay

## 2020-09-23 DIAGNOSIS — K529 Noninfective gastroenteritis and colitis, unspecified: Secondary | ICD-10-CM | POA: Diagnosis not present

## 2020-09-23 NOTE — Progress Notes (Signed)
Dana Macdonald, M.D. Gastroenterology & Hepatology [facility] [address] [place], [place] [address] Primary Care Physician: Redmond School, MD [address] [place] [address]  Referring MD: PCP  Chief Complaint: Ileitis  History of Present Illness: Dana Macdonald is a 75 y.o. female who presents for evaluation of ileitis.  Patient reports that on Wednesday afternoon she had new onset of RLQ pain suddenly which was very severe and caused significant discomfort.  She described the pain as pressure. States she felt a "bulge in her RLQ"  which made her more concerned.  She was concerned about the possibility of appendicitis she decided to come to the ER at [facility] on 09/18/2020. In the ER, the patient underwent blood testing that showed a normal CBC, CMP and urinalysis.  CMP was performed the same day which showed presence of thickening of the terminal ileum at the ileocecal valve with mild dilatation of the distal jejunum and ileum.I personally reviewed these findings and found presence of localized area of wall edema in the terminal ileum with questionable fecalization, which could be related to the inflammation she had time.  She received some morphine for pain control and she was prescribed Augmentin and dicyclomine upon discharge for management of ileitis, which she has been taking compliantly. She reports that she has felt better since starting the medication. She has noticed her stools are slightly more loose than usual but not watery. Has tried to advance her diet slowly as she is scared the food will make her symptoms come back.   Notably, the patient reports that in 2014 she had a "numbness feeling" in her R flank . She had a colonoscopy for evaluation of these symptoms I described below.  Nevertheless, she did not have any symptoms in between her current presentation and symptoms in 2014.  She did not notice any  symptoms before the new onset of symptoms on Wednesday.  The patient denies having any nausea, vomiting, fever, chills, hematochezia, melena, hematemesis, abdominal distention, abdominal pain, diarrhea, jaundice, pruritus. Has tried to lose weight recently on purpose.  Last Colonoscopy:2014 Prep excellent. Normal mucosa of terminal ileum. Scattered erosions noted at cecum. Biopsy taken but negative for active inflammation.  Erosion secondary to use of NSAIDs. Scattered diverticula at transverse colon. Normal rectal mucosa. Small hemorrhoids below the dentate line.  FHx: neg for any gastrointestinal/liver disease, no malignancies Social: neg smoking, alcohol or illicit drug use Surgical: tubal ligation  Past Medical History: Past Medical History:  Diagnosis Date  . Seasonal allergies   . Spinal stenosis     Past Surgical History: Past Surgical History:  Procedure Laterality Date  . COLONOSCOPY N/A 08/25/2012   Procedure: COLONOSCOPY;  Surgeon: Rogene Houston, MD;  Location: [facility];  Service: Endoscopy;  Laterality: N/A;  830  . Torn achilles     bilaterally in the 90s  . TUBAL LIGATION     early 3s    Family History:History reviewed. No pertinent family history.  Social History: Social History   Tobacco Use  Smoking Status Never Smoker  Smokeless Tobacco Never Used   Social History   Substance and Sexual Activity  Alcohol Use Yes   Comment: social   Social History   Substance and Sexual Activity  Drug Use No    Allergies: No Known Allergies  Medications: Current Outpatient Medications  Medication Sig Dispense Refill  . amoxicillin-clavulanate (AUGMENTIN) 875-125 MG tablet Take 1 tablet by mouth every  12 (twelve) hours. 14 tablet 0  . dicyclomine (BENTYL) 20 MG tablet Take 1 tablet (20 mg total) by mouth 3 (three) times daily before meals. 30 tablet 0  . glucosamine-chondroitin 500-400 MG tablet Take 2 tablets by mouth daily.    . Multiple Vitamin  (MULTIVITAMIN) tablet Take 1 tablet by mouth daily.    . Multiple Vitamins-Minerals (EMERGEN-C IMMUNE PO) Take by mouth. daily     No current facility-administered medications for this visit.    Review of Systems: GENERAL: negative for malaise, night sweats HEENT: No changes in hearing or vision, no nose bleeds or other nasal problems. NECK: Negative for lumps, goiter, pain and significant neck swelling RESPIRATORY: Negative for cough, wheezing CARDIOVASCULAR: Negative for chest pain, leg swelling, palpitations, orthopnea GI: SEE HPI MUSCULOSKELETAL: Negative for joint pain or swelling, back pain, and muscle pain. SKIN: Negative for lesions, rash PSYCH: Negative for sleep disturbance, mood disorder and recent psychosocial stressors. HEMATOLOGY Negative for prolonged bleeding, bruising easily, and swollen nodes. ENDOCRINE: Negative for cold or heat intolerance, polyuria, polydipsia and goiter. NEURO: negative for tremor, gait imbalance, syncope and seizures. The remainder of the review of systems is noncontributory.   Physical Exam: BP (!) 145/84 (BP Location: Left Arm, Patient Position: Sitting, Cuff Size: Large)   Pulse 69   Temp (!) 97.5 F (36.4 C) (Oral)   Ht 5' 6.5" (1.689 m)   Wt 203 lb (92.1 kg)   BMI 32.27 kg/m  GENERAL: The patient is AO x3, in no acute distress. HEENT: Head is normocephalic and atraumatic. EOMI are intact. Mouth is well hydrated and without lesions. NECK: Supple. No masses LUNGS: Clear to auscultation. No presence of rhonchi/wheezing/rales. Adequate chest expansion HEART: RRR, normal s1 and s2. ABDOMEN: Soft, nontender, no guarding, no peritoneal signs, and nondistended. BS +. No masses. EXTREMITIES: Without any cyanosis, clubbing, rash, lesions or edema. NEUROLOGIC: AOx3, no focal motor deficit. SKIN: no jaundice, no rashes   Imaging/Labs: as above  I personally reviewed and interpreted the available labs, imaging and endoscopic  files.  Impression and Plan: Dana Macdonald is a 75 y.o. female who presents for evaluation of ileitis.  The patient presented sudden onset of abdominal pain, found to have ileitis in most recent abdominal imaging performed while in the ER.  I discussed with her that the findings of ileitis could have multiple etiologies but given the absence of symptoms prior to her initial presentation less than a week ago make an infectious etiology of her ileitis most likely.  She has been clinically improving with the use of antibiotics antispasmodic.  I advised her to finish the antibiotic course completely and to take the Bentyl as needed when she has pain.  She can advance her diet as tolerated for now.  If she were to have recurrent symptoms of abdominal pain, we will need to proceed with repeat colonoscopy to evaluate the terminal ileum area to rule out other etiologies such as Crohn's disease or other inflammatory causes.  The patient understood and agreed with this.  Finally, she can take some probiotics to improve her bowel movement consistency as she may have developed antibiotic induced changes.  - Patient to call us back if he have any recurrent episodes of abdominal pain or diarrhea after finishing antibiotic course - Bentyl as needed for abdominal pain - Finish Augmentin course completely - Can take daily probiotic over-the-counter for 1 month to improve bowel movement consistency  All questions were answered.      Quillian Quince  Jenetta Downer, MD Gastroenterology and Hepatology [facility]

## 2020-09-23 NOTE — Patient Instructions (Signed)
Please call us back if he have any recurrent episodes of abdominal pain or diarrhea after finishing your antibiotic course Finish Augmentin completely.   Can take daily probiotic over-the-counter for 1 month to improve bowel movement consistency

## 2020-09-25 DIAGNOSIS — J301 Allergic rhinitis due to pollen: Secondary | ICD-10-CM | POA: Diagnosis not present

## 2020-09-25 DIAGNOSIS — J3089 Other allergic rhinitis: Secondary | ICD-10-CM | POA: Diagnosis not present

## 2020-09-25 DIAGNOSIS — J3081 Allergic rhinitis due to animal (cat) (dog) hair and dander: Secondary | ICD-10-CM | POA: Diagnosis not present

## 2020-10-02 DIAGNOSIS — J3089 Other allergic rhinitis: Secondary | ICD-10-CM | POA: Diagnosis not present

## 2020-10-02 DIAGNOSIS — J3081 Allergic rhinitis due to animal (cat) (dog) hair and dander: Secondary | ICD-10-CM | POA: Diagnosis not present

## 2020-10-02 DIAGNOSIS — J301 Allergic rhinitis due to pollen: Secondary | ICD-10-CM | POA: Diagnosis not present

## 2020-10-05 DIAGNOSIS — J3089 Other allergic rhinitis: Secondary | ICD-10-CM | POA: Diagnosis not present

## 2020-10-05 DIAGNOSIS — J301 Allergic rhinitis due to pollen: Secondary | ICD-10-CM | POA: Diagnosis not present

## 2020-10-05 DIAGNOSIS — J3081 Allergic rhinitis due to animal (cat) (dog) hair and dander: Secondary | ICD-10-CM | POA: Diagnosis not present

## 2020-10-08 DIAGNOSIS — J3089 Other allergic rhinitis: Secondary | ICD-10-CM | POA: Diagnosis not present

## 2020-10-08 DIAGNOSIS — J301 Allergic rhinitis due to pollen: Secondary | ICD-10-CM | POA: Diagnosis not present

## 2020-10-08 DIAGNOSIS — J3081 Allergic rhinitis due to animal (cat) (dog) hair and dander: Secondary | ICD-10-CM | POA: Diagnosis not present

## 2020-10-17 DIAGNOSIS — J3089 Other allergic rhinitis: Secondary | ICD-10-CM | POA: Diagnosis not present

## 2020-10-17 DIAGNOSIS — J3081 Allergic rhinitis due to animal (cat) (dog) hair and dander: Secondary | ICD-10-CM | POA: Diagnosis not present

## 2020-10-17 DIAGNOSIS — J301 Allergic rhinitis due to pollen: Secondary | ICD-10-CM | POA: Diagnosis not present

## 2020-10-21 DIAGNOSIS — J3081 Allergic rhinitis due to animal (cat) (dog) hair and dander: Secondary | ICD-10-CM | POA: Diagnosis not present

## 2020-10-21 DIAGNOSIS — J301 Allergic rhinitis due to pollen: Secondary | ICD-10-CM | POA: Diagnosis not present

## 2020-10-21 DIAGNOSIS — J3089 Other allergic rhinitis: Secondary | ICD-10-CM | POA: Diagnosis not present

## 2020-10-30 DIAGNOSIS — J3081 Allergic rhinitis due to animal (cat) (dog) hair and dander: Secondary | ICD-10-CM | POA: Diagnosis not present

## 2020-10-30 DIAGNOSIS — J301 Allergic rhinitis due to pollen: Secondary | ICD-10-CM | POA: Diagnosis not present

## 2020-10-30 DIAGNOSIS — J3089 Other allergic rhinitis: Secondary | ICD-10-CM | POA: Diagnosis not present

## 2020-11-05 DIAGNOSIS — J301 Allergic rhinitis due to pollen: Secondary | ICD-10-CM | POA: Diagnosis not present

## 2020-11-05 DIAGNOSIS — J3089 Other allergic rhinitis: Secondary | ICD-10-CM | POA: Diagnosis not present

## 2020-11-05 DIAGNOSIS — J3081 Allergic rhinitis due to animal (cat) (dog) hair and dander: Secondary | ICD-10-CM | POA: Diagnosis not present

## 2020-11-13 DIAGNOSIS — J301 Allergic rhinitis due to pollen: Secondary | ICD-10-CM | POA: Diagnosis not present

## 2020-11-13 DIAGNOSIS — J3081 Allergic rhinitis due to animal (cat) (dog) hair and dander: Secondary | ICD-10-CM | POA: Diagnosis not present

## 2020-11-13 DIAGNOSIS — J3089 Other allergic rhinitis: Secondary | ICD-10-CM | POA: Diagnosis not present

## 2020-11-20 DIAGNOSIS — R059 Cough, unspecified: Secondary | ICD-10-CM | POA: Diagnosis not present

## 2020-11-20 DIAGNOSIS — H1045 Other chronic allergic conjunctivitis: Secondary | ICD-10-CM | POA: Diagnosis not present

## 2020-11-20 DIAGNOSIS — J301 Allergic rhinitis due to pollen: Secondary | ICD-10-CM | POA: Diagnosis not present

## 2020-11-20 DIAGNOSIS — J3089 Other allergic rhinitis: Secondary | ICD-10-CM | POA: Diagnosis not present

## 2020-11-27 DIAGNOSIS — J3089 Other allergic rhinitis: Secondary | ICD-10-CM | POA: Diagnosis not present

## 2020-11-27 DIAGNOSIS — J301 Allergic rhinitis due to pollen: Secondary | ICD-10-CM | POA: Diagnosis not present

## 2020-11-27 DIAGNOSIS — J3081 Allergic rhinitis due to animal (cat) (dog) hair and dander: Secondary | ICD-10-CM | POA: Diagnosis not present

## 2020-12-11 ENCOUNTER — Ambulatory Visit (INDEPENDENT_AMBULATORY_CARE_PROVIDER_SITE_OTHER): Payer: Medicare Other | Admitting: Gastroenterology

## 2020-12-11 DIAGNOSIS — J301 Allergic rhinitis due to pollen: Secondary | ICD-10-CM | POA: Diagnosis not present

## 2020-12-11 DIAGNOSIS — J3089 Other allergic rhinitis: Secondary | ICD-10-CM | POA: Diagnosis not present

## 2020-12-11 DIAGNOSIS — J3081 Allergic rhinitis due to animal (cat) (dog) hair and dander: Secondary | ICD-10-CM | POA: Diagnosis not present

## 2020-12-20 DIAGNOSIS — J301 Allergic rhinitis due to pollen: Secondary | ICD-10-CM | POA: Diagnosis not present

## 2020-12-20 DIAGNOSIS — J3089 Other allergic rhinitis: Secondary | ICD-10-CM | POA: Diagnosis not present

## 2020-12-20 DIAGNOSIS — J3081 Allergic rhinitis due to animal (cat) (dog) hair and dander: Secondary | ICD-10-CM | POA: Diagnosis not present

## 2020-12-24 DIAGNOSIS — J3089 Other allergic rhinitis: Secondary | ICD-10-CM | POA: Diagnosis not present

## 2020-12-24 DIAGNOSIS — J301 Allergic rhinitis due to pollen: Secondary | ICD-10-CM | POA: Diagnosis not present

## 2020-12-24 DIAGNOSIS — J3081 Allergic rhinitis due to animal (cat) (dog) hair and dander: Secondary | ICD-10-CM | POA: Diagnosis not present

## 2021-01-01 DIAGNOSIS — J301 Allergic rhinitis due to pollen: Secondary | ICD-10-CM | POA: Diagnosis not present

## 2021-01-01 DIAGNOSIS — J3089 Other allergic rhinitis: Secondary | ICD-10-CM | POA: Diagnosis not present

## 2021-01-01 DIAGNOSIS — J3081 Allergic rhinitis due to animal (cat) (dog) hair and dander: Secondary | ICD-10-CM | POA: Diagnosis not present

## 2021-01-08 DIAGNOSIS — J3089 Other allergic rhinitis: Secondary | ICD-10-CM | POA: Diagnosis not present

## 2021-01-08 DIAGNOSIS — J3081 Allergic rhinitis due to animal (cat) (dog) hair and dander: Secondary | ICD-10-CM | POA: Diagnosis not present

## 2021-01-08 DIAGNOSIS — J301 Allergic rhinitis due to pollen: Secondary | ICD-10-CM | POA: Diagnosis not present

## 2021-01-14 DIAGNOSIS — J3089 Other allergic rhinitis: Secondary | ICD-10-CM | POA: Diagnosis not present

## 2021-01-14 DIAGNOSIS — J301 Allergic rhinitis due to pollen: Secondary | ICD-10-CM | POA: Diagnosis not present

## 2021-01-14 DIAGNOSIS — J3081 Allergic rhinitis due to animal (cat) (dog) hair and dander: Secondary | ICD-10-CM | POA: Diagnosis not present

## 2021-01-28 DIAGNOSIS — J301 Allergic rhinitis due to pollen: Secondary | ICD-10-CM | POA: Diagnosis not present

## 2021-01-28 DIAGNOSIS — J3089 Other allergic rhinitis: Secondary | ICD-10-CM | POA: Diagnosis not present

## 2021-01-28 DIAGNOSIS — J3081 Allergic rhinitis due to animal (cat) (dog) hair and dander: Secondary | ICD-10-CM | POA: Diagnosis not present

## 2021-02-10 DIAGNOSIS — J3081 Allergic rhinitis due to animal (cat) (dog) hair and dander: Secondary | ICD-10-CM | POA: Diagnosis not present

## 2021-02-10 DIAGNOSIS — J3089 Other allergic rhinitis: Secondary | ICD-10-CM | POA: Diagnosis not present

## 2021-02-10 DIAGNOSIS — J301 Allergic rhinitis due to pollen: Secondary | ICD-10-CM | POA: Diagnosis not present

## 2021-02-25 DIAGNOSIS — J301 Allergic rhinitis due to pollen: Secondary | ICD-10-CM | POA: Diagnosis not present

## 2021-02-25 DIAGNOSIS — J3081 Allergic rhinitis due to animal (cat) (dog) hair and dander: Secondary | ICD-10-CM | POA: Diagnosis not present

## 2021-02-25 DIAGNOSIS — J3089 Other allergic rhinitis: Secondary | ICD-10-CM | POA: Diagnosis not present

## 2021-03-10 DIAGNOSIS — J301 Allergic rhinitis due to pollen: Secondary | ICD-10-CM | POA: Diagnosis not present

## 2021-03-10 DIAGNOSIS — J3089 Other allergic rhinitis: Secondary | ICD-10-CM | POA: Diagnosis not present

## 2021-03-10 DIAGNOSIS — J3081 Allergic rhinitis due to animal (cat) (dog) hair and dander: Secondary | ICD-10-CM | POA: Diagnosis not present

## 2021-03-18 DIAGNOSIS — J301 Allergic rhinitis due to pollen: Secondary | ICD-10-CM | POA: Diagnosis not present

## 2021-03-18 DIAGNOSIS — J3081 Allergic rhinitis due to animal (cat) (dog) hair and dander: Secondary | ICD-10-CM | POA: Diagnosis not present

## 2021-03-18 DIAGNOSIS — J3089 Other allergic rhinitis: Secondary | ICD-10-CM | POA: Diagnosis not present

## 2021-04-03 DIAGNOSIS — J301 Allergic rhinitis due to pollen: Secondary | ICD-10-CM | POA: Diagnosis not present

## 2021-04-03 DIAGNOSIS — J3089 Other allergic rhinitis: Secondary | ICD-10-CM | POA: Diagnosis not present

## 2021-04-03 DIAGNOSIS — J3081 Allergic rhinitis due to animal (cat) (dog) hair and dander: Secondary | ICD-10-CM | POA: Diagnosis not present

## 2021-04-15 DIAGNOSIS — J301 Allergic rhinitis due to pollen: Secondary | ICD-10-CM | POA: Diagnosis not present

## 2021-04-15 DIAGNOSIS — J3081 Allergic rhinitis due to animal (cat) (dog) hair and dander: Secondary | ICD-10-CM | POA: Diagnosis not present

## 2021-04-15 DIAGNOSIS — J3089 Other allergic rhinitis: Secondary | ICD-10-CM | POA: Diagnosis not present

## 2021-04-19 DIAGNOSIS — Z23 Encounter for immunization: Secondary | ICD-10-CM | POA: Diagnosis not present

## 2021-04-29 DIAGNOSIS — J3089 Other allergic rhinitis: Secondary | ICD-10-CM | POA: Diagnosis not present

## 2021-04-29 DIAGNOSIS — J301 Allergic rhinitis due to pollen: Secondary | ICD-10-CM | POA: Diagnosis not present

## 2021-04-29 DIAGNOSIS — J3081 Allergic rhinitis due to animal (cat) (dog) hair and dander: Secondary | ICD-10-CM | POA: Diagnosis not present

## 2021-05-19 DIAGNOSIS — J3081 Allergic rhinitis due to animal (cat) (dog) hair and dander: Secondary | ICD-10-CM | POA: Diagnosis not present

## 2021-05-19 DIAGNOSIS — J301 Allergic rhinitis due to pollen: Secondary | ICD-10-CM | POA: Diagnosis not present

## 2021-05-19 DIAGNOSIS — J3089 Other allergic rhinitis: Secondary | ICD-10-CM | POA: Diagnosis not present

## 2021-05-29 DIAGNOSIS — H25813 Combined forms of age-related cataract, bilateral: Secondary | ICD-10-CM | POA: Diagnosis not present

## 2021-06-02 DIAGNOSIS — J3089 Other allergic rhinitis: Secondary | ICD-10-CM | POA: Diagnosis not present

## 2021-06-02 DIAGNOSIS — J301 Allergic rhinitis due to pollen: Secondary | ICD-10-CM | POA: Diagnosis not present

## 2021-06-02 DIAGNOSIS — J3081 Allergic rhinitis due to animal (cat) (dog) hair and dander: Secondary | ICD-10-CM | POA: Diagnosis not present

## 2021-06-18 DIAGNOSIS — J3089 Other allergic rhinitis: Secondary | ICD-10-CM | POA: Diagnosis not present

## 2021-06-18 DIAGNOSIS — J3081 Allergic rhinitis due to animal (cat) (dog) hair and dander: Secondary | ICD-10-CM | POA: Diagnosis not present

## 2021-06-18 DIAGNOSIS — J301 Allergic rhinitis due to pollen: Secondary | ICD-10-CM | POA: Diagnosis not present

## 2021-07-02 ENCOUNTER — Other Ambulatory Visit (HOSPITAL_COMMUNITY): Payer: Self-pay | Admitting: Family Medicine

## 2021-07-02 ENCOUNTER — Other Ambulatory Visit (HOSPITAL_COMMUNITY): Payer: Self-pay | Admitting: Internal Medicine

## 2021-07-02 DIAGNOSIS — Z1231 Encounter for screening mammogram for malignant neoplasm of breast: Secondary | ICD-10-CM

## 2021-07-02 DIAGNOSIS — J3089 Other allergic rhinitis: Secondary | ICD-10-CM | POA: Diagnosis not present

## 2021-07-02 DIAGNOSIS — J301 Allergic rhinitis due to pollen: Secondary | ICD-10-CM | POA: Diagnosis not present

## 2021-07-02 DIAGNOSIS — J3081 Allergic rhinitis due to animal (cat) (dog) hair and dander: Secondary | ICD-10-CM | POA: Diagnosis not present

## 2021-07-15 DIAGNOSIS — J3089 Other allergic rhinitis: Secondary | ICD-10-CM | POA: Diagnosis not present

## 2021-07-15 DIAGNOSIS — J301 Allergic rhinitis due to pollen: Secondary | ICD-10-CM | POA: Diagnosis not present

## 2021-07-21 DIAGNOSIS — E669 Obesity, unspecified: Secondary | ICD-10-CM | POA: Diagnosis not present

## 2021-07-21 DIAGNOSIS — R7309 Other abnormal glucose: Secondary | ICD-10-CM | POA: Diagnosis not present

## 2021-07-21 DIAGNOSIS — Z1331 Encounter for screening for depression: Secondary | ICD-10-CM | POA: Diagnosis not present

## 2021-07-21 DIAGNOSIS — E559 Vitamin D deficiency, unspecified: Secondary | ICD-10-CM | POA: Diagnosis not present

## 2021-07-21 DIAGNOSIS — E7849 Other hyperlipidemia: Secondary | ICD-10-CM | POA: Diagnosis not present

## 2021-07-21 DIAGNOSIS — E782 Mixed hyperlipidemia: Secondary | ICD-10-CM | POA: Diagnosis not present

## 2021-07-21 DIAGNOSIS — Z0000 Encounter for general adult medical examination without abnormal findings: Secondary | ICD-10-CM | POA: Diagnosis not present

## 2021-07-21 DIAGNOSIS — Z6831 Body mass index (BMI) 31.0-31.9, adult: Secondary | ICD-10-CM | POA: Diagnosis not present

## 2021-07-28 ENCOUNTER — Other Ambulatory Visit: Payer: Self-pay

## 2021-07-28 ENCOUNTER — Ambulatory Visit (HOSPITAL_COMMUNITY)
Admission: RE | Admit: 2021-07-28 | Discharge: 2021-07-28 | Disposition: A | Discharge status: Home / Self Care | Payer: Medicare Other | Source: Ambulatory Visit | Attending: Family Medicine | Admitting: Family Medicine

## 2021-07-28 DIAGNOSIS — Z1231 Encounter for screening mammogram for malignant neoplasm of breast: Secondary | ICD-10-CM | POA: Insufficient documentation

## 2021-07-29 DIAGNOSIS — J301 Allergic rhinitis due to pollen: Secondary | ICD-10-CM | POA: Diagnosis not present

## 2021-07-29 DIAGNOSIS — J3089 Other allergic rhinitis: Secondary | ICD-10-CM | POA: Diagnosis not present

## 2021-07-29 DIAGNOSIS — J3081 Allergic rhinitis due to animal (cat) (dog) hair and dander: Secondary | ICD-10-CM | POA: Diagnosis not present

## 2021-08-06 DIAGNOSIS — J301 Allergic rhinitis due to pollen: Secondary | ICD-10-CM | POA: Diagnosis not present

## 2021-08-06 DIAGNOSIS — J3081 Allergic rhinitis due to animal (cat) (dog) hair and dander: Secondary | ICD-10-CM | POA: Diagnosis not present

## 2021-08-06 DIAGNOSIS — J3089 Other allergic rhinitis: Secondary | ICD-10-CM | POA: Diagnosis not present

## 2021-08-13 DIAGNOSIS — J301 Allergic rhinitis due to pollen: Secondary | ICD-10-CM | POA: Diagnosis not present

## 2021-08-13 DIAGNOSIS — J3081 Allergic rhinitis due to animal (cat) (dog) hair and dander: Secondary | ICD-10-CM | POA: Diagnosis not present

## 2021-08-13 DIAGNOSIS — J3089 Other allergic rhinitis: Secondary | ICD-10-CM | POA: Diagnosis not present

## 2021-08-18 DIAGNOSIS — J301 Allergic rhinitis due to pollen: Secondary | ICD-10-CM | POA: Diagnosis not present

## 2021-08-18 DIAGNOSIS — J3081 Allergic rhinitis due to animal (cat) (dog) hair and dander: Secondary | ICD-10-CM | POA: Diagnosis not present

## 2021-08-18 DIAGNOSIS — J3089 Other allergic rhinitis: Secondary | ICD-10-CM | POA: Diagnosis not present

## 2021-09-02 DIAGNOSIS — J3089 Other allergic rhinitis: Secondary | ICD-10-CM | POA: Diagnosis not present

## 2021-09-02 DIAGNOSIS — J3081 Allergic rhinitis due to animal (cat) (dog) hair and dander: Secondary | ICD-10-CM | POA: Diagnosis not present

## 2021-09-02 DIAGNOSIS — J301 Allergic rhinitis due to pollen: Secondary | ICD-10-CM | POA: Diagnosis not present

## 2021-09-12 DIAGNOSIS — M79641 Pain in right hand: Secondary | ICD-10-CM | POA: Diagnosis not present

## 2021-09-16 DIAGNOSIS — J301 Allergic rhinitis due to pollen: Secondary | ICD-10-CM | POA: Diagnosis not present

## 2021-09-16 DIAGNOSIS — J3081 Allergic rhinitis due to animal (cat) (dog) hair and dander: Secondary | ICD-10-CM | POA: Diagnosis not present

## 2021-09-16 DIAGNOSIS — J3089 Other allergic rhinitis: Secondary | ICD-10-CM | POA: Diagnosis not present

## 2021-09-24 DIAGNOSIS — J3081 Allergic rhinitis due to animal (cat) (dog) hair and dander: Secondary | ICD-10-CM | POA: Diagnosis not present

## 2021-09-24 DIAGNOSIS — J3089 Other allergic rhinitis: Secondary | ICD-10-CM | POA: Diagnosis not present

## 2021-09-24 DIAGNOSIS — J301 Allergic rhinitis due to pollen: Secondary | ICD-10-CM | POA: Diagnosis not present

## 2021-10-07 DIAGNOSIS — J301 Allergic rhinitis due to pollen: Secondary | ICD-10-CM | POA: Diagnosis not present

## 2021-10-07 DIAGNOSIS — J3089 Other allergic rhinitis: Secondary | ICD-10-CM | POA: Diagnosis not present

## 2021-10-13 DIAGNOSIS — M65341 Trigger finger, right ring finger: Secondary | ICD-10-CM | POA: Diagnosis not present

## 2021-10-22 DIAGNOSIS — J3089 Other allergic rhinitis: Secondary | ICD-10-CM | POA: Diagnosis not present

## 2021-10-22 DIAGNOSIS — J301 Allergic rhinitis due to pollen: Secondary | ICD-10-CM | POA: Diagnosis not present

## 2021-10-22 DIAGNOSIS — J3081 Allergic rhinitis due to animal (cat) (dog) hair and dander: Secondary | ICD-10-CM | POA: Diagnosis not present

## 2021-11-05 DIAGNOSIS — J301 Allergic rhinitis due to pollen: Secondary | ICD-10-CM | POA: Diagnosis not present

## 2021-11-05 DIAGNOSIS — J3081 Allergic rhinitis due to animal (cat) (dog) hair and dander: Secondary | ICD-10-CM | POA: Diagnosis not present

## 2021-11-05 DIAGNOSIS — J3089 Other allergic rhinitis: Secondary | ICD-10-CM | POA: Diagnosis not present

## 2021-11-12 DIAGNOSIS — J3089 Other allergic rhinitis: Secondary | ICD-10-CM | POA: Diagnosis not present

## 2021-11-12 DIAGNOSIS — J3081 Allergic rhinitis due to animal (cat) (dog) hair and dander: Secondary | ICD-10-CM | POA: Diagnosis not present

## 2021-11-12 DIAGNOSIS — J301 Allergic rhinitis due to pollen: Secondary | ICD-10-CM | POA: Diagnosis not present

## 2021-11-26 DIAGNOSIS — H1045 Other chronic allergic conjunctivitis: Secondary | ICD-10-CM | POA: Diagnosis not present

## 2021-11-26 DIAGNOSIS — J3089 Other allergic rhinitis: Secondary | ICD-10-CM | POA: Diagnosis not present

## 2021-11-26 DIAGNOSIS — J301 Allergic rhinitis due to pollen: Secondary | ICD-10-CM | POA: Diagnosis not present

## 2021-11-26 DIAGNOSIS — R052 Subacute cough: Secondary | ICD-10-CM | POA: Diagnosis not present

## 2021-11-26 DIAGNOSIS — J3081 Allergic rhinitis due to animal (cat) (dog) hair and dander: Secondary | ICD-10-CM | POA: Diagnosis not present

## 2021-12-03 DIAGNOSIS — J3081 Allergic rhinitis due to animal (cat) (dog) hair and dander: Secondary | ICD-10-CM | POA: Diagnosis not present

## 2021-12-03 DIAGNOSIS — J301 Allergic rhinitis due to pollen: Secondary | ICD-10-CM | POA: Diagnosis not present

## 2021-12-03 DIAGNOSIS — J3089 Other allergic rhinitis: Secondary | ICD-10-CM | POA: Diagnosis not present

## 2021-12-17 DIAGNOSIS — J301 Allergic rhinitis due to pollen: Secondary | ICD-10-CM | POA: Diagnosis not present

## 2021-12-17 DIAGNOSIS — J3081 Allergic rhinitis due to animal (cat) (dog) hair and dander: Secondary | ICD-10-CM | POA: Diagnosis not present

## 2021-12-17 DIAGNOSIS — J3089 Other allergic rhinitis: Secondary | ICD-10-CM | POA: Diagnosis not present

## 2021-12-25 DIAGNOSIS — J3081 Allergic rhinitis due to animal (cat) (dog) hair and dander: Secondary | ICD-10-CM | POA: Diagnosis not present

## 2021-12-25 DIAGNOSIS — J301 Allergic rhinitis due to pollen: Secondary | ICD-10-CM | POA: Diagnosis not present

## 2021-12-25 DIAGNOSIS — J3089 Other allergic rhinitis: Secondary | ICD-10-CM | POA: Diagnosis not present

## 2022-01-01 DIAGNOSIS — J3089 Other allergic rhinitis: Secondary | ICD-10-CM | POA: Diagnosis not present

## 2022-01-01 DIAGNOSIS — J301 Allergic rhinitis due to pollen: Secondary | ICD-10-CM | POA: Diagnosis not present

## 2022-01-01 DIAGNOSIS — J3081 Allergic rhinitis due to animal (cat) (dog) hair and dander: Secondary | ICD-10-CM | POA: Diagnosis not present

## 2022-01-15 DIAGNOSIS — J3089 Other allergic rhinitis: Secondary | ICD-10-CM | POA: Diagnosis not present

## 2022-01-15 DIAGNOSIS — J3081 Allergic rhinitis due to animal (cat) (dog) hair and dander: Secondary | ICD-10-CM | POA: Diagnosis not present

## 2022-01-15 DIAGNOSIS — J301 Allergic rhinitis due to pollen: Secondary | ICD-10-CM | POA: Diagnosis not present

## 2022-01-22 DIAGNOSIS — J3081 Allergic rhinitis due to animal (cat) (dog) hair and dander: Secondary | ICD-10-CM | POA: Diagnosis not present

## 2022-01-22 DIAGNOSIS — J301 Allergic rhinitis due to pollen: Secondary | ICD-10-CM | POA: Diagnosis not present

## 2022-01-22 DIAGNOSIS — J3089 Other allergic rhinitis: Secondary | ICD-10-CM | POA: Diagnosis not present

## 2022-01-28 DIAGNOSIS — J3081 Allergic rhinitis due to animal (cat) (dog) hair and dander: Secondary | ICD-10-CM | POA: Diagnosis not present

## 2022-01-28 DIAGNOSIS — J3089 Other allergic rhinitis: Secondary | ICD-10-CM | POA: Diagnosis not present

## 2022-01-28 DIAGNOSIS — J301 Allergic rhinitis due to pollen: Secondary | ICD-10-CM | POA: Diagnosis not present

## 2022-02-11 DIAGNOSIS — J301 Allergic rhinitis due to pollen: Secondary | ICD-10-CM | POA: Diagnosis not present

## 2022-02-11 DIAGNOSIS — J3089 Other allergic rhinitis: Secondary | ICD-10-CM | POA: Diagnosis not present

## 2022-02-11 DIAGNOSIS — J3081 Allergic rhinitis due to animal (cat) (dog) hair and dander: Secondary | ICD-10-CM | POA: Diagnosis not present

## 2022-02-25 DIAGNOSIS — J301 Allergic rhinitis due to pollen: Secondary | ICD-10-CM | POA: Diagnosis not present

## 2022-02-25 DIAGNOSIS — J3089 Other allergic rhinitis: Secondary | ICD-10-CM | POA: Diagnosis not present

## 2022-02-25 DIAGNOSIS — J3081 Allergic rhinitis due to animal (cat) (dog) hair and dander: Secondary | ICD-10-CM | POA: Diagnosis not present

## 2022-03-04 DIAGNOSIS — J3081 Allergic rhinitis due to animal (cat) (dog) hair and dander: Secondary | ICD-10-CM | POA: Diagnosis not present

## 2022-03-04 DIAGNOSIS — J3089 Other allergic rhinitis: Secondary | ICD-10-CM | POA: Diagnosis not present

## 2022-03-04 DIAGNOSIS — J301 Allergic rhinitis due to pollen: Secondary | ICD-10-CM | POA: Diagnosis not present

## 2022-03-18 DIAGNOSIS — J3081 Allergic rhinitis due to animal (cat) (dog) hair and dander: Secondary | ICD-10-CM | POA: Diagnosis not present

## 2022-03-18 DIAGNOSIS — J301 Allergic rhinitis due to pollen: Secondary | ICD-10-CM | POA: Diagnosis not present

## 2022-03-18 DIAGNOSIS — J3089 Other allergic rhinitis: Secondary | ICD-10-CM | POA: Diagnosis not present

## 2022-04-01 DIAGNOSIS — J301 Allergic rhinitis due to pollen: Secondary | ICD-10-CM | POA: Diagnosis not present

## 2022-04-01 DIAGNOSIS — J3081 Allergic rhinitis due to animal (cat) (dog) hair and dander: Secondary | ICD-10-CM | POA: Diagnosis not present

## 2022-04-01 DIAGNOSIS — J3089 Other allergic rhinitis: Secondary | ICD-10-CM | POA: Diagnosis not present

## 2022-04-01 IMAGING — MG MM DIGITAL SCREENING BILAT W/ TOMO AND CAD
8 series · 8 of 24 positions shown · non-contrast
Comparison: Previous exam(s).

CLINICAL DATA: Screening.

EXAM:
DIGITAL SCREENING BILATERAL MAMMOGRAM WITH TOMOSYNTHESIS AND CAD
TECHNIQUE: Bilateral screening digital craniocaudal and mediolateral oblique
mammograms were obtained. Bilateral screening digital breast
tomosynthesis was performed. The images were evaluated with
computer-aided detection.

[R CC synth-2D]
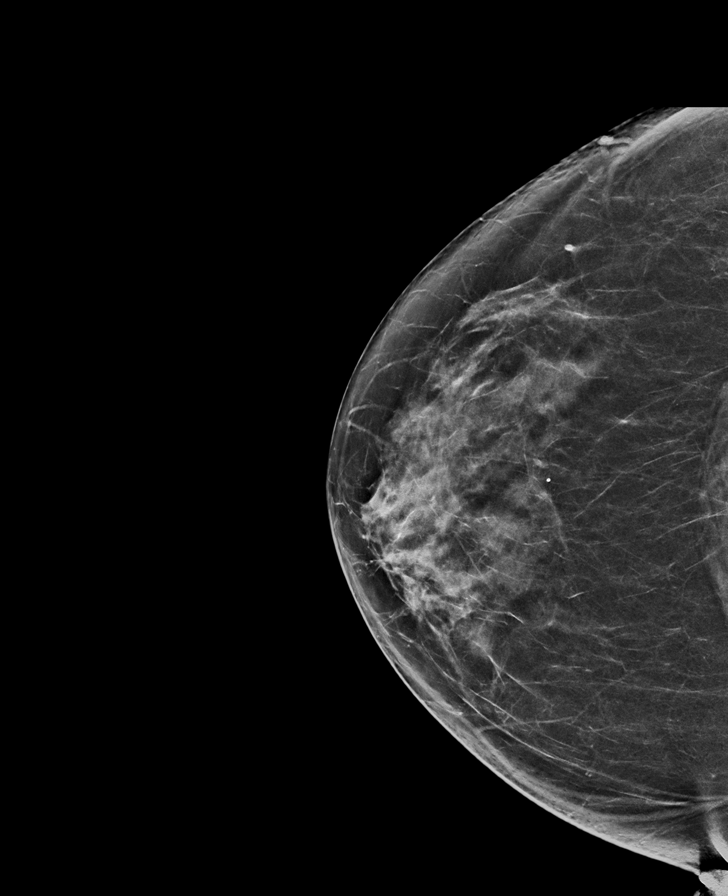

[R MLO synth-2D]
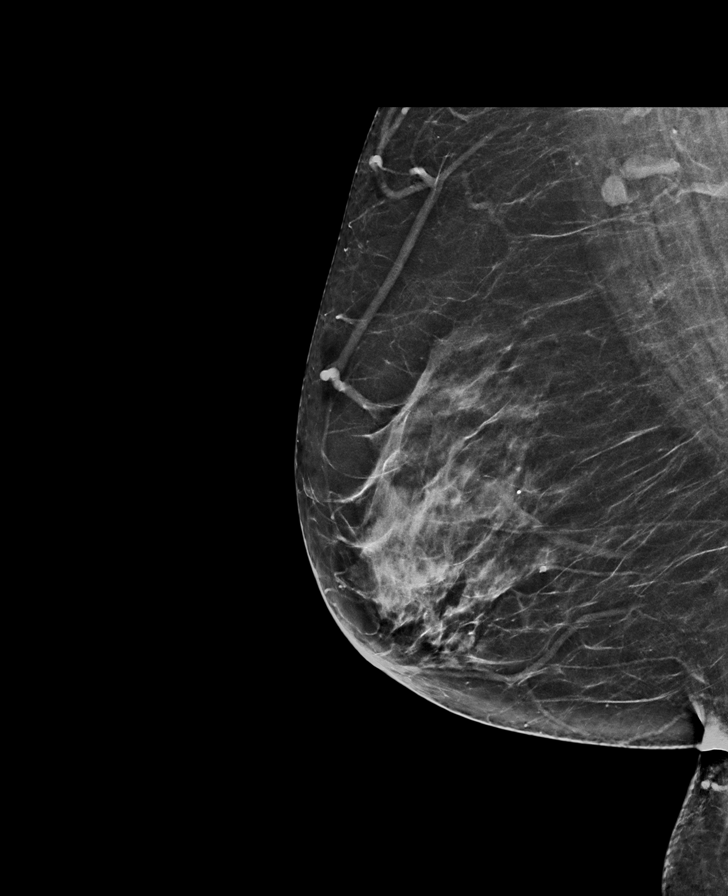

[L MLO synth-2D]
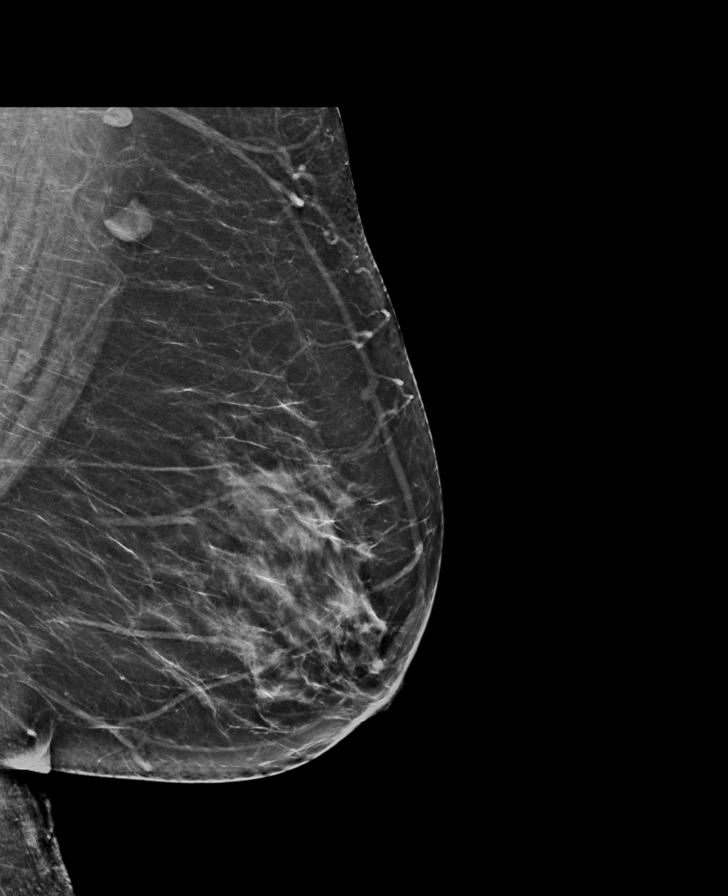

[L CC synth-2D]
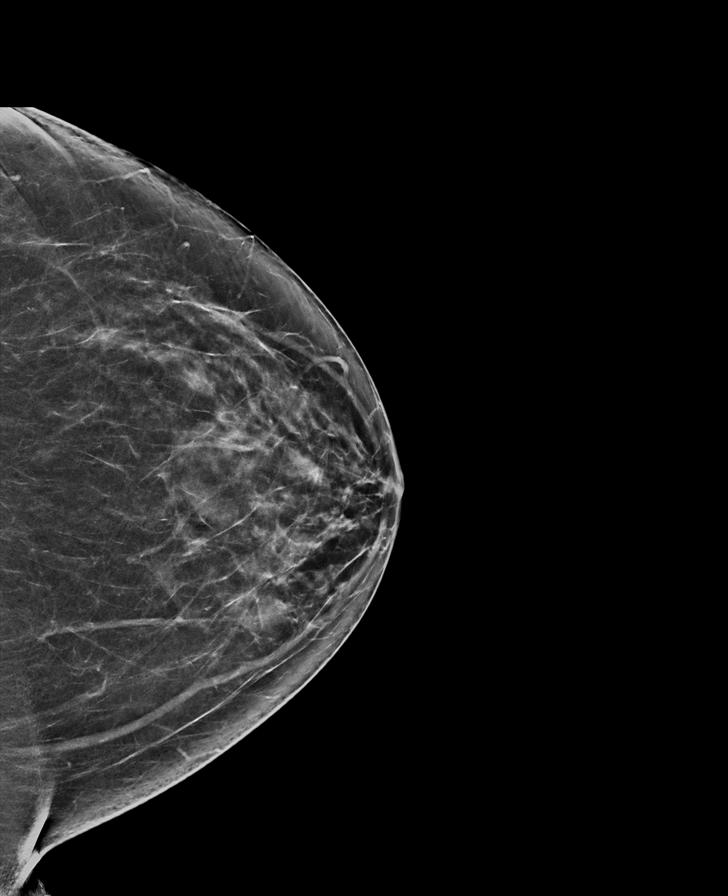

[L CC tomo · tomo slice 36/71.0]
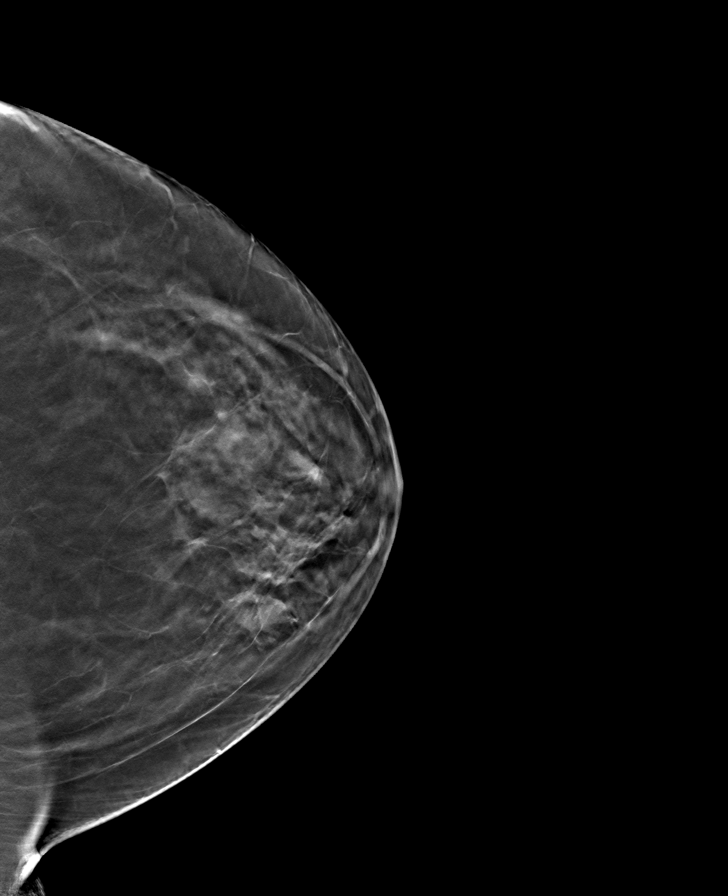

[R MLO tomo · tomo slice 36/71.0]
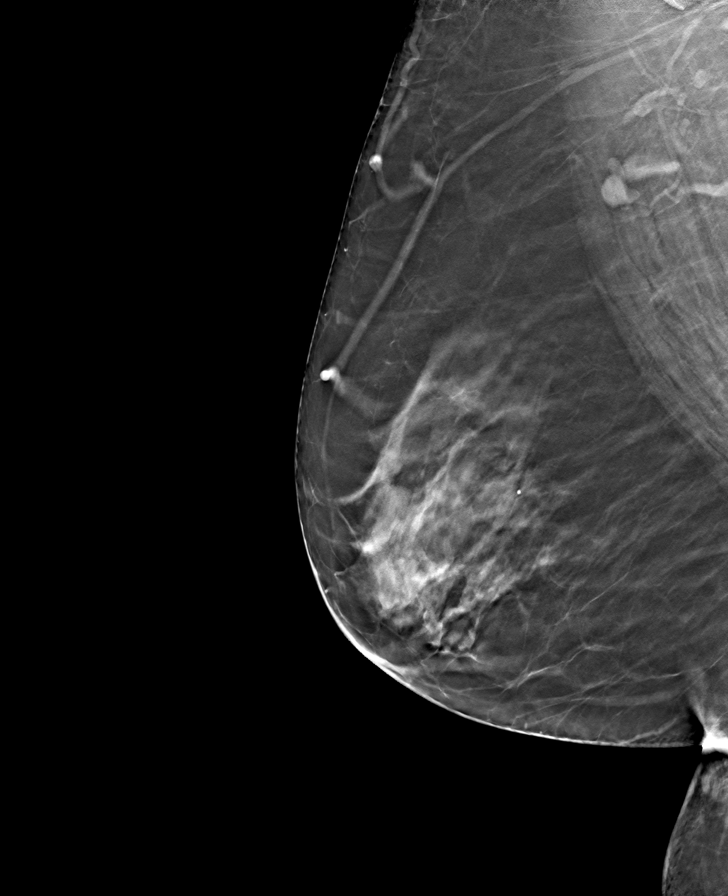

[R CC tomo · tomo slice 36/71.0]
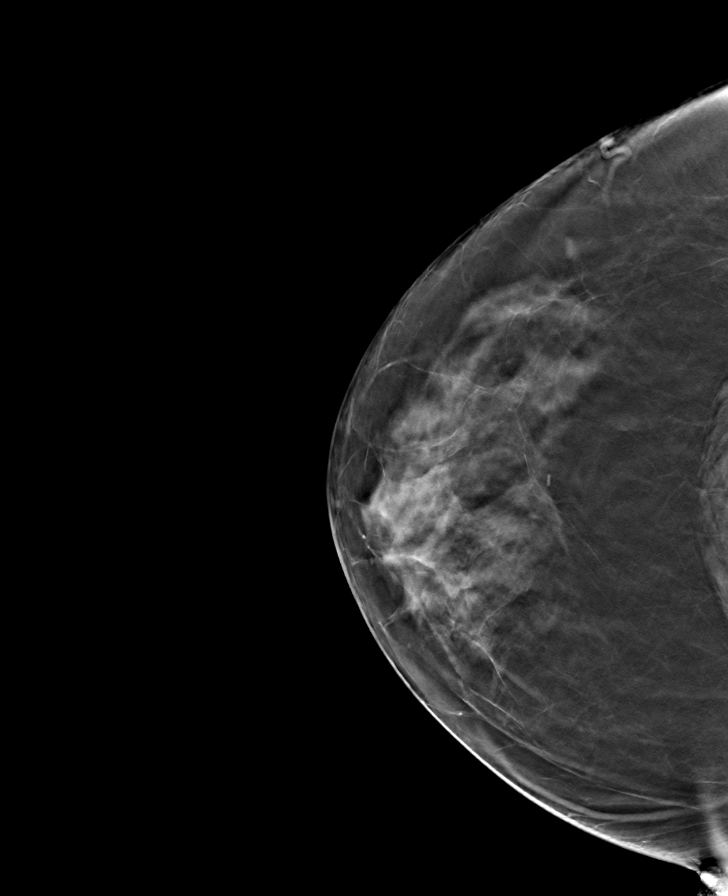

[L MLO tomo · tomo slice 37/74.0]
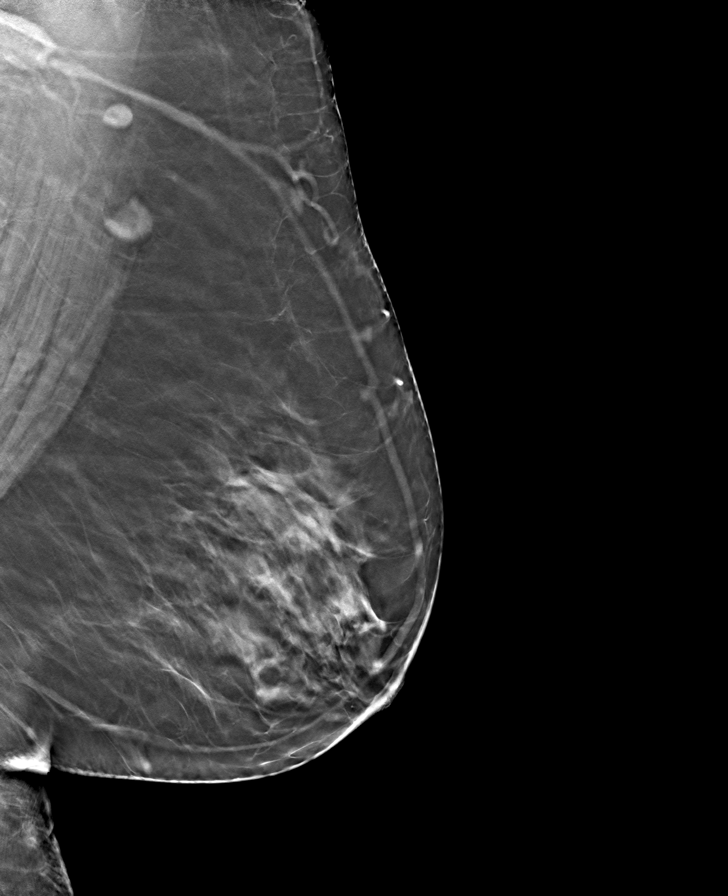

[8 of 24 positions shown; findings below may reference images not displayed]

ACR Breast Density Category c: The breast tissue is heterogeneously
dense, which may obscure small masses.
FINDINGS: There are no findings suspicious for malignancy.
IMPRESSION: No mammographic evidence of malignancy. A result letter of this
screening mammogram will be mailed directly to the patient.

RECOMMENDATION:
Screening mammogram in one year. (Code:Q3-W-BC3)

BI-RADS CATEGORY  1: Negative.

## 2022-04-15 DIAGNOSIS — J3081 Allergic rhinitis due to animal (cat) (dog) hair and dander: Secondary | ICD-10-CM | POA: Diagnosis not present

## 2022-04-15 DIAGNOSIS — J3089 Other allergic rhinitis: Secondary | ICD-10-CM | POA: Diagnosis not present

## 2022-04-15 DIAGNOSIS — J301 Allergic rhinitis due to pollen: Secondary | ICD-10-CM | POA: Diagnosis not present

## 2022-04-23 DIAGNOSIS — J3089 Other allergic rhinitis: Secondary | ICD-10-CM | POA: Diagnosis not present

## 2022-04-23 DIAGNOSIS — J3081 Allergic rhinitis due to animal (cat) (dog) hair and dander: Secondary | ICD-10-CM | POA: Diagnosis not present

## 2022-04-23 DIAGNOSIS — J301 Allergic rhinitis due to pollen: Secondary | ICD-10-CM | POA: Diagnosis not present

## 2022-04-30 DIAGNOSIS — J301 Allergic rhinitis due to pollen: Secondary | ICD-10-CM | POA: Diagnosis not present

## 2022-04-30 DIAGNOSIS — J3089 Other allergic rhinitis: Secondary | ICD-10-CM | POA: Diagnosis not present

## 2022-04-30 DIAGNOSIS — J3081 Allergic rhinitis due to animal (cat) (dog) hair and dander: Secondary | ICD-10-CM | POA: Diagnosis not present

## 2022-05-13 DIAGNOSIS — J3089 Other allergic rhinitis: Secondary | ICD-10-CM | POA: Diagnosis not present

## 2022-05-13 DIAGNOSIS — J3081 Allergic rhinitis due to animal (cat) (dog) hair and dander: Secondary | ICD-10-CM | POA: Diagnosis not present

## 2022-05-13 DIAGNOSIS — J301 Allergic rhinitis due to pollen: Secondary | ICD-10-CM | POA: Diagnosis not present

## 2022-06-02 DIAGNOSIS — H5203 Hypermetropia, bilateral: Secondary | ICD-10-CM | POA: Diagnosis not present

## 2022-06-02 DIAGNOSIS — H52203 Unspecified astigmatism, bilateral: Secondary | ICD-10-CM | POA: Diagnosis not present

## 2022-06-02 DIAGNOSIS — H524 Presbyopia: Secondary | ICD-10-CM | POA: Diagnosis not present

## 2022-06-02 DIAGNOSIS — H25813 Combined forms of age-related cataract, bilateral: Secondary | ICD-10-CM | POA: Diagnosis not present

## 2022-06-09 DIAGNOSIS — J3081 Allergic rhinitis due to animal (cat) (dog) hair and dander: Secondary | ICD-10-CM | POA: Diagnosis not present

## 2022-06-09 DIAGNOSIS — J301 Allergic rhinitis due to pollen: Secondary | ICD-10-CM | POA: Diagnosis not present

## 2022-06-09 DIAGNOSIS — J3089 Other allergic rhinitis: Secondary | ICD-10-CM | POA: Diagnosis not present

## 2022-07-08 DIAGNOSIS — J3089 Other allergic rhinitis: Secondary | ICD-10-CM | POA: Diagnosis not present

## 2022-07-08 DIAGNOSIS — J3081 Allergic rhinitis due to animal (cat) (dog) hair and dander: Secondary | ICD-10-CM | POA: Diagnosis not present

## 2022-07-08 DIAGNOSIS — J301 Allergic rhinitis due to pollen: Secondary | ICD-10-CM | POA: Diagnosis not present

## 2022-07-10 ENCOUNTER — Other Ambulatory Visit (HOSPITAL_COMMUNITY): Payer: Self-pay | Admitting: Family Medicine

## 2022-07-10 DIAGNOSIS — Z1231 Encounter for screening mammogram for malignant neoplasm of breast: Secondary | ICD-10-CM

## 2022-07-15 DIAGNOSIS — J3081 Allergic rhinitis due to animal (cat) (dog) hair and dander: Secondary | ICD-10-CM | POA: Diagnosis not present

## 2022-07-15 DIAGNOSIS — J301 Allergic rhinitis due to pollen: Secondary | ICD-10-CM | POA: Diagnosis not present

## 2022-07-15 DIAGNOSIS — J3089 Other allergic rhinitis: Secondary | ICD-10-CM | POA: Diagnosis not present

## 2022-07-27 DIAGNOSIS — J3081 Allergic rhinitis due to animal (cat) (dog) hair and dander: Secondary | ICD-10-CM | POA: Diagnosis not present

## 2022-07-27 DIAGNOSIS — J301 Allergic rhinitis due to pollen: Secondary | ICD-10-CM | POA: Diagnosis not present

## 2022-07-27 DIAGNOSIS — J3089 Other allergic rhinitis: Secondary | ICD-10-CM | POA: Diagnosis not present

## 2022-07-30 ENCOUNTER — Ambulatory Visit (HOSPITAL_COMMUNITY)
Admission: RE | Admit: 2022-07-30 | Discharge: 2022-07-30 | Disposition: A | Discharge status: Home / Self Care | Payer: Medicare Other | Source: Ambulatory Visit | Attending: Family Medicine | Admitting: Family Medicine

## 2022-07-30 DIAGNOSIS — Z1231 Encounter for screening mammogram for malignant neoplasm of breast: Secondary | ICD-10-CM | POA: Diagnosis not present

## 2022-08-05 DIAGNOSIS — J301 Allergic rhinitis due to pollen: Secondary | ICD-10-CM | POA: Diagnosis not present

## 2022-08-05 DIAGNOSIS — J3089 Other allergic rhinitis: Secondary | ICD-10-CM | POA: Diagnosis not present

## 2022-08-05 DIAGNOSIS — J3081 Allergic rhinitis due to animal (cat) (dog) hair and dander: Secondary | ICD-10-CM | POA: Diagnosis not present

## 2022-08-18 DIAGNOSIS — J3089 Other allergic rhinitis: Secondary | ICD-10-CM | POA: Diagnosis not present

## 2022-08-18 DIAGNOSIS — J3081 Allergic rhinitis due to animal (cat) (dog) hair and dander: Secondary | ICD-10-CM | POA: Diagnosis not present

## 2022-08-18 DIAGNOSIS — J301 Allergic rhinitis due to pollen: Secondary | ICD-10-CM | POA: Diagnosis not present

## 2022-08-19 ENCOUNTER — Encounter (INDEPENDENT_AMBULATORY_CARE_PROVIDER_SITE_OTHER): Payer: Self-pay | Admitting: *Deleted

## 2022-08-27 DIAGNOSIS — J3089 Other allergic rhinitis: Secondary | ICD-10-CM | POA: Diagnosis not present

## 2022-08-27 DIAGNOSIS — J301 Allergic rhinitis due to pollen: Secondary | ICD-10-CM | POA: Diagnosis not present

## 2022-08-27 DIAGNOSIS — J3081 Allergic rhinitis due to animal (cat) (dog) hair and dander: Secondary | ICD-10-CM | POA: Diagnosis not present

## 2022-09-03 DIAGNOSIS — J301 Allergic rhinitis due to pollen: Secondary | ICD-10-CM | POA: Diagnosis not present

## 2022-09-03 DIAGNOSIS — J3081 Allergic rhinitis due to animal (cat) (dog) hair and dander: Secondary | ICD-10-CM | POA: Diagnosis not present

## 2022-09-03 DIAGNOSIS — J3089 Other allergic rhinitis: Secondary | ICD-10-CM | POA: Diagnosis not present

## 2022-09-10 DIAGNOSIS — J3081 Allergic rhinitis due to animal (cat) (dog) hair and dander: Secondary | ICD-10-CM | POA: Diagnosis not present

## 2022-09-10 DIAGNOSIS — J301 Allergic rhinitis due to pollen: Secondary | ICD-10-CM | POA: Diagnosis not present

## 2022-09-10 DIAGNOSIS — J3089 Other allergic rhinitis: Secondary | ICD-10-CM | POA: Diagnosis not present

## 2022-09-16 DIAGNOSIS — R7309 Other abnormal glucose: Secondary | ICD-10-CM | POA: Diagnosis not present

## 2022-09-16 DIAGNOSIS — Z0000 Encounter for general adult medical examination without abnormal findings: Secondary | ICD-10-CM | POA: Diagnosis not present

## 2022-09-16 DIAGNOSIS — E782 Mixed hyperlipidemia: Secondary | ICD-10-CM | POA: Diagnosis not present

## 2022-09-16 DIAGNOSIS — E559 Vitamin D deficiency, unspecified: Secondary | ICD-10-CM | POA: Diagnosis not present

## 2022-09-17 DIAGNOSIS — J3089 Other allergic rhinitis: Secondary | ICD-10-CM | POA: Diagnosis not present

## 2022-09-17 DIAGNOSIS — J301 Allergic rhinitis due to pollen: Secondary | ICD-10-CM | POA: Diagnosis not present

## 2022-09-17 DIAGNOSIS — J3081 Allergic rhinitis due to animal (cat) (dog) hair and dander: Secondary | ICD-10-CM | POA: Diagnosis not present

## 2022-09-22 DIAGNOSIS — Z1331 Encounter for screening for depression: Secondary | ICD-10-CM | POA: Diagnosis not present

## 2022-09-22 DIAGNOSIS — E6609 Other obesity due to excess calories: Secondary | ICD-10-CM | POA: Diagnosis not present

## 2022-09-22 DIAGNOSIS — Z6832 Body mass index (BMI) 32.0-32.9, adult: Secondary | ICD-10-CM | POA: Diagnosis not present

## 2022-09-22 DIAGNOSIS — E782 Mixed hyperlipidemia: Secondary | ICD-10-CM | POA: Diagnosis not present

## 2022-09-22 DIAGNOSIS — R7309 Other abnormal glucose: Secondary | ICD-10-CM | POA: Diagnosis not present

## 2022-09-22 DIAGNOSIS — Z0000 Encounter for general adult medical examination without abnormal findings: Secondary | ICD-10-CM | POA: Diagnosis not present

## 2022-09-22 DIAGNOSIS — E559 Vitamin D deficiency, unspecified: Secondary | ICD-10-CM | POA: Diagnosis not present

## 2022-09-28 DIAGNOSIS — J301 Allergic rhinitis due to pollen: Secondary | ICD-10-CM | POA: Diagnosis not present

## 2022-09-28 DIAGNOSIS — J3081 Allergic rhinitis due to animal (cat) (dog) hair and dander: Secondary | ICD-10-CM | POA: Diagnosis not present

## 2022-09-28 DIAGNOSIS — J3089 Other allergic rhinitis: Secondary | ICD-10-CM | POA: Diagnosis not present

## 2022-10-08 DIAGNOSIS — J301 Allergic rhinitis due to pollen: Secondary | ICD-10-CM | POA: Diagnosis not present

## 2022-10-08 DIAGNOSIS — J3089 Other allergic rhinitis: Secondary | ICD-10-CM | POA: Diagnosis not present

## 2022-10-08 DIAGNOSIS — J3081 Allergic rhinitis due to animal (cat) (dog) hair and dander: Secondary | ICD-10-CM | POA: Diagnosis not present

## 2022-10-21 DIAGNOSIS — J3081 Allergic rhinitis due to animal (cat) (dog) hair and dander: Secondary | ICD-10-CM | POA: Diagnosis not present

## 2022-10-21 DIAGNOSIS — J3089 Other allergic rhinitis: Secondary | ICD-10-CM | POA: Diagnosis not present

## 2022-10-21 DIAGNOSIS — J301 Allergic rhinitis due to pollen: Secondary | ICD-10-CM | POA: Diagnosis not present

## 2022-11-04 DIAGNOSIS — J301 Allergic rhinitis due to pollen: Secondary | ICD-10-CM | POA: Diagnosis not present

## 2022-11-04 DIAGNOSIS — J3081 Allergic rhinitis due to animal (cat) (dog) hair and dander: Secondary | ICD-10-CM | POA: Diagnosis not present

## 2022-11-04 DIAGNOSIS — J3089 Other allergic rhinitis: Secondary | ICD-10-CM | POA: Diagnosis not present

## 2022-11-25 DIAGNOSIS — R052 Subacute cough: Secondary | ICD-10-CM | POA: Diagnosis not present

## 2022-11-25 DIAGNOSIS — J3089 Other allergic rhinitis: Secondary | ICD-10-CM | POA: Diagnosis not present

## 2022-11-25 DIAGNOSIS — H1045 Other chronic allergic conjunctivitis: Secondary | ICD-10-CM | POA: Diagnosis not present

## 2022-11-25 DIAGNOSIS — J3081 Allergic rhinitis due to animal (cat) (dog) hair and dander: Secondary | ICD-10-CM | POA: Diagnosis not present

## 2022-11-25 DIAGNOSIS — J301 Allergic rhinitis due to pollen: Secondary | ICD-10-CM | POA: Diagnosis not present

## 2022-12-03 DIAGNOSIS — J3089 Other allergic rhinitis: Secondary | ICD-10-CM | POA: Diagnosis not present

## 2022-12-03 DIAGNOSIS — J3081 Allergic rhinitis due to animal (cat) (dog) hair and dander: Secondary | ICD-10-CM | POA: Diagnosis not present

## 2022-12-03 DIAGNOSIS — J301 Allergic rhinitis due to pollen: Secondary | ICD-10-CM | POA: Diagnosis not present

## 2022-12-08 DIAGNOSIS — J3089 Other allergic rhinitis: Secondary | ICD-10-CM | POA: Diagnosis not present

## 2022-12-08 DIAGNOSIS — J3081 Allergic rhinitis due to animal (cat) (dog) hair and dander: Secondary | ICD-10-CM | POA: Diagnosis not present

## 2022-12-08 DIAGNOSIS — J301 Allergic rhinitis due to pollen: Secondary | ICD-10-CM | POA: Diagnosis not present

## 2022-12-29 DIAGNOSIS — J3089 Other allergic rhinitis: Secondary | ICD-10-CM | POA: Diagnosis not present

## 2022-12-29 DIAGNOSIS — J301 Allergic rhinitis due to pollen: Secondary | ICD-10-CM | POA: Diagnosis not present

## 2022-12-29 DIAGNOSIS — J3081 Allergic rhinitis due to animal (cat) (dog) hair and dander: Secondary | ICD-10-CM | POA: Diagnosis not present

## 2023-01-05 DIAGNOSIS — J3089 Other allergic rhinitis: Secondary | ICD-10-CM | POA: Diagnosis not present

## 2023-01-05 DIAGNOSIS — J301 Allergic rhinitis due to pollen: Secondary | ICD-10-CM | POA: Diagnosis not present

## 2023-01-05 DIAGNOSIS — J3081 Allergic rhinitis due to animal (cat) (dog) hair and dander: Secondary | ICD-10-CM | POA: Diagnosis not present

## 2023-01-14 DIAGNOSIS — J3081 Allergic rhinitis due to animal (cat) (dog) hair and dander: Secondary | ICD-10-CM | POA: Diagnosis not present

## 2023-01-14 DIAGNOSIS — J3089 Other allergic rhinitis: Secondary | ICD-10-CM | POA: Diagnosis not present

## 2023-01-14 DIAGNOSIS — J301 Allergic rhinitis due to pollen: Secondary | ICD-10-CM | POA: Diagnosis not present

## 2023-01-20 DIAGNOSIS — J301 Allergic rhinitis due to pollen: Secondary | ICD-10-CM | POA: Diagnosis not present

## 2023-01-20 DIAGNOSIS — J3081 Allergic rhinitis due to animal (cat) (dog) hair and dander: Secondary | ICD-10-CM | POA: Diagnosis not present

## 2023-01-20 DIAGNOSIS — J3089 Other allergic rhinitis: Secondary | ICD-10-CM | POA: Diagnosis not present

## 2023-02-10 DIAGNOSIS — J301 Allergic rhinitis due to pollen: Secondary | ICD-10-CM | POA: Diagnosis not present

## 2023-02-10 DIAGNOSIS — J3081 Allergic rhinitis due to animal (cat) (dog) hair and dander: Secondary | ICD-10-CM | POA: Diagnosis not present

## 2023-02-10 DIAGNOSIS — J3089 Other allergic rhinitis: Secondary | ICD-10-CM | POA: Diagnosis not present

## 2023-03-04 DIAGNOSIS — J3081 Allergic rhinitis due to animal (cat) (dog) hair and dander: Secondary | ICD-10-CM | POA: Diagnosis not present

## 2023-03-04 DIAGNOSIS — J3089 Other allergic rhinitis: Secondary | ICD-10-CM | POA: Diagnosis not present

## 2023-03-04 DIAGNOSIS — J301 Allergic rhinitis due to pollen: Secondary | ICD-10-CM | POA: Diagnosis not present

## 2023-03-11 DIAGNOSIS — J301 Allergic rhinitis due to pollen: Secondary | ICD-10-CM | POA: Diagnosis not present

## 2023-03-11 DIAGNOSIS — J3089 Other allergic rhinitis: Secondary | ICD-10-CM | POA: Diagnosis not present

## 2023-03-11 DIAGNOSIS — J3081 Allergic rhinitis due to animal (cat) (dog) hair and dander: Secondary | ICD-10-CM | POA: Diagnosis not present

## 2023-04-06 DIAGNOSIS — Z23 Encounter for immunization: Secondary | ICD-10-CM | POA: Diagnosis not present

## 2023-04-15 DIAGNOSIS — J301 Allergic rhinitis due to pollen: Secondary | ICD-10-CM | POA: Diagnosis not present

## 2023-04-15 DIAGNOSIS — J3089 Other allergic rhinitis: Secondary | ICD-10-CM | POA: Diagnosis not present

## 2023-04-15 DIAGNOSIS — J3081 Allergic rhinitis due to animal (cat) (dog) hair and dander: Secondary | ICD-10-CM | POA: Diagnosis not present

## 2023-05-05 DIAGNOSIS — J3089 Other allergic rhinitis: Secondary | ICD-10-CM | POA: Diagnosis not present

## 2023-05-05 DIAGNOSIS — J301 Allergic rhinitis due to pollen: Secondary | ICD-10-CM | POA: Diagnosis not present

## 2023-05-05 DIAGNOSIS — J3081 Allergic rhinitis due to animal (cat) (dog) hair and dander: Secondary | ICD-10-CM | POA: Diagnosis not present

## 2023-05-11 DIAGNOSIS — J3081 Allergic rhinitis due to animal (cat) (dog) hair and dander: Secondary | ICD-10-CM | POA: Diagnosis not present

## 2023-05-11 DIAGNOSIS — J301 Allergic rhinitis due to pollen: Secondary | ICD-10-CM | POA: Diagnosis not present

## 2023-05-11 DIAGNOSIS — J3089 Other allergic rhinitis: Secondary | ICD-10-CM | POA: Diagnosis not present

## 2023-06-03 DIAGNOSIS — J3081 Allergic rhinitis due to animal (cat) (dog) hair and dander: Secondary | ICD-10-CM | POA: Diagnosis not present

## 2023-06-03 DIAGNOSIS — J301 Allergic rhinitis due to pollen: Secondary | ICD-10-CM | POA: Diagnosis not present

## 2023-06-03 DIAGNOSIS — J3089 Other allergic rhinitis: Secondary | ICD-10-CM | POA: Diagnosis not present

## 2023-06-07 DIAGNOSIS — J3089 Other allergic rhinitis: Secondary | ICD-10-CM | POA: Diagnosis not present

## 2023-06-07 DIAGNOSIS — J3081 Allergic rhinitis due to animal (cat) (dog) hair and dander: Secondary | ICD-10-CM | POA: Diagnosis not present

## 2023-06-07 DIAGNOSIS — J301 Allergic rhinitis due to pollen: Secondary | ICD-10-CM | POA: Diagnosis not present

## 2023-06-16 DIAGNOSIS — J3081 Allergic rhinitis due to animal (cat) (dog) hair and dander: Secondary | ICD-10-CM | POA: Diagnosis not present

## 2023-06-16 DIAGNOSIS — J3089 Other allergic rhinitis: Secondary | ICD-10-CM | POA: Diagnosis not present

## 2023-06-16 DIAGNOSIS — J301 Allergic rhinitis due to pollen: Secondary | ICD-10-CM | POA: Diagnosis not present

## 2023-06-18 DIAGNOSIS — M170 Bilateral primary osteoarthritis of knee: Secondary | ICD-10-CM | POA: Diagnosis not present

## 2023-07-02 DIAGNOSIS — J3081 Allergic rhinitis due to animal (cat) (dog) hair and dander: Secondary | ICD-10-CM | POA: Diagnosis not present

## 2023-07-02 DIAGNOSIS — J3089 Other allergic rhinitis: Secondary | ICD-10-CM | POA: Diagnosis not present

## 2023-07-02 DIAGNOSIS — J301 Allergic rhinitis due to pollen: Secondary | ICD-10-CM | POA: Diagnosis not present

## 2023-07-06 ENCOUNTER — Other Ambulatory Visit (HOSPITAL_COMMUNITY): Payer: Self-pay | Admitting: Family Medicine

## 2023-07-06 DIAGNOSIS — H2513 Age-related nuclear cataract, bilateral: Secondary | ICD-10-CM | POA: Diagnosis not present

## 2023-07-06 DIAGNOSIS — Z1231 Encounter for screening mammogram for malignant neoplasm of breast: Secondary | ICD-10-CM

## 2023-07-12 DIAGNOSIS — J3089 Other allergic rhinitis: Secondary | ICD-10-CM | POA: Diagnosis not present

## 2023-07-12 DIAGNOSIS — J3081 Allergic rhinitis due to animal (cat) (dog) hair and dander: Secondary | ICD-10-CM | POA: Diagnosis not present

## 2023-07-12 DIAGNOSIS — J301 Allergic rhinitis due to pollen: Secondary | ICD-10-CM | POA: Diagnosis not present

## 2023-07-27 DIAGNOSIS — J3089 Other allergic rhinitis: Secondary | ICD-10-CM | POA: Diagnosis not present

## 2023-07-27 DIAGNOSIS — J301 Allergic rhinitis due to pollen: Secondary | ICD-10-CM | POA: Diagnosis not present

## 2023-07-27 DIAGNOSIS — J3081 Allergic rhinitis due to animal (cat) (dog) hair and dander: Secondary | ICD-10-CM | POA: Diagnosis not present

## 2023-08-02 ENCOUNTER — Ambulatory Visit (HOSPITAL_COMMUNITY)
Admission: RE | Admit: 2023-08-02 | Discharge: 2023-08-02 | Disposition: A | Discharge status: Home / Self Care | Payer: Medicare Other | Source: Ambulatory Visit | Attending: Family Medicine | Admitting: Family Medicine

## 2023-08-02 DIAGNOSIS — Z1231 Encounter for screening mammogram for malignant neoplasm of breast: Secondary | ICD-10-CM | POA: Insufficient documentation

## 2023-08-06 DIAGNOSIS — J3089 Other allergic rhinitis: Secondary | ICD-10-CM | POA: Diagnosis not present

## 2023-08-06 DIAGNOSIS — J3081 Allergic rhinitis due to animal (cat) (dog) hair and dander: Secondary | ICD-10-CM | POA: Diagnosis not present

## 2023-08-06 DIAGNOSIS — J301 Allergic rhinitis due to pollen: Secondary | ICD-10-CM | POA: Diagnosis not present

## 2023-08-11 DIAGNOSIS — J301 Allergic rhinitis due to pollen: Secondary | ICD-10-CM | POA: Diagnosis not present

## 2023-08-11 DIAGNOSIS — J3089 Other allergic rhinitis: Secondary | ICD-10-CM | POA: Diagnosis not present

## 2023-08-11 DIAGNOSIS — J3081 Allergic rhinitis due to animal (cat) (dog) hair and dander: Secondary | ICD-10-CM | POA: Diagnosis not present

## 2023-08-20 DIAGNOSIS — J3081 Allergic rhinitis due to animal (cat) (dog) hair and dander: Secondary | ICD-10-CM | POA: Diagnosis not present

## 2023-08-20 DIAGNOSIS — J3089 Other allergic rhinitis: Secondary | ICD-10-CM | POA: Diagnosis not present

## 2023-08-20 DIAGNOSIS — J301 Allergic rhinitis due to pollen: Secondary | ICD-10-CM | POA: Diagnosis not present

## 2023-08-25 DIAGNOSIS — J3081 Allergic rhinitis due to animal (cat) (dog) hair and dander: Secondary | ICD-10-CM | POA: Diagnosis not present

## 2023-08-25 DIAGNOSIS — J301 Allergic rhinitis due to pollen: Secondary | ICD-10-CM | POA: Diagnosis not present

## 2023-08-25 DIAGNOSIS — J3089 Other allergic rhinitis: Secondary | ICD-10-CM | POA: Diagnosis not present

## 2023-08-31 DIAGNOSIS — J301 Allergic rhinitis due to pollen: Secondary | ICD-10-CM | POA: Diagnosis not present

## 2023-08-31 DIAGNOSIS — J3089 Other allergic rhinitis: Secondary | ICD-10-CM | POA: Diagnosis not present

## 2023-08-31 DIAGNOSIS — J3081 Allergic rhinitis due to animal (cat) (dog) hair and dander: Secondary | ICD-10-CM | POA: Diagnosis not present

## 2023-09-07 DIAGNOSIS — J3081 Allergic rhinitis due to animal (cat) (dog) hair and dander: Secondary | ICD-10-CM | POA: Diagnosis not present

## 2023-09-07 DIAGNOSIS — J301 Allergic rhinitis due to pollen: Secondary | ICD-10-CM | POA: Diagnosis not present

## 2023-09-07 DIAGNOSIS — J3089 Other allergic rhinitis: Secondary | ICD-10-CM | POA: Diagnosis not present

## 2023-09-16 DIAGNOSIS — J301 Allergic rhinitis due to pollen: Secondary | ICD-10-CM | POA: Diagnosis not present

## 2023-09-16 DIAGNOSIS — J3081 Allergic rhinitis due to animal (cat) (dog) hair and dander: Secondary | ICD-10-CM | POA: Diagnosis not present

## 2023-09-16 DIAGNOSIS — J3089 Other allergic rhinitis: Secondary | ICD-10-CM | POA: Diagnosis not present

## 2023-09-21 DIAGNOSIS — J3081 Allergic rhinitis due to animal (cat) (dog) hair and dander: Secondary | ICD-10-CM | POA: Diagnosis not present

## 2023-09-21 DIAGNOSIS — J3089 Other allergic rhinitis: Secondary | ICD-10-CM | POA: Diagnosis not present

## 2023-09-21 DIAGNOSIS — J301 Allergic rhinitis due to pollen: Secondary | ICD-10-CM | POA: Diagnosis not present

## 2023-09-28 DIAGNOSIS — J301 Allergic rhinitis due to pollen: Secondary | ICD-10-CM | POA: Diagnosis not present

## 2023-09-28 DIAGNOSIS — J3089 Other allergic rhinitis: Secondary | ICD-10-CM | POA: Diagnosis not present

## 2023-09-28 DIAGNOSIS — J3081 Allergic rhinitis due to animal (cat) (dog) hair and dander: Secondary | ICD-10-CM | POA: Diagnosis not present

## 2023-10-04 DIAGNOSIS — J3089 Other allergic rhinitis: Secondary | ICD-10-CM | POA: Diagnosis not present

## 2023-10-04 DIAGNOSIS — J3081 Allergic rhinitis due to animal (cat) (dog) hair and dander: Secondary | ICD-10-CM | POA: Diagnosis not present

## 2023-10-04 DIAGNOSIS — J301 Allergic rhinitis due to pollen: Secondary | ICD-10-CM | POA: Diagnosis not present

## 2023-10-21 DIAGNOSIS — J3089 Other allergic rhinitis: Secondary | ICD-10-CM | POA: Diagnosis not present

## 2023-10-21 DIAGNOSIS — J301 Allergic rhinitis due to pollen: Secondary | ICD-10-CM | POA: Diagnosis not present

## 2023-10-21 DIAGNOSIS — J3081 Allergic rhinitis due to animal (cat) (dog) hair and dander: Secondary | ICD-10-CM | POA: Diagnosis not present

## 2023-11-09 DIAGNOSIS — J3081 Allergic rhinitis due to animal (cat) (dog) hair and dander: Secondary | ICD-10-CM | POA: Diagnosis not present

## 2023-11-09 DIAGNOSIS — J3089 Other allergic rhinitis: Secondary | ICD-10-CM | POA: Diagnosis not present

## 2023-11-09 DIAGNOSIS — J301 Allergic rhinitis due to pollen: Secondary | ICD-10-CM | POA: Diagnosis not present

## 2023-11-10 DIAGNOSIS — J3089 Other allergic rhinitis: Secondary | ICD-10-CM | POA: Diagnosis not present

## 2023-11-10 DIAGNOSIS — R052 Subacute cough: Secondary | ICD-10-CM | POA: Diagnosis not present

## 2023-11-10 DIAGNOSIS — J301 Allergic rhinitis due to pollen: Secondary | ICD-10-CM | POA: Diagnosis not present

## 2023-11-10 DIAGNOSIS — H1045 Other chronic allergic conjunctivitis: Secondary | ICD-10-CM | POA: Diagnosis not present

## 2023-12-07 DIAGNOSIS — J301 Allergic rhinitis due to pollen: Secondary | ICD-10-CM | POA: Diagnosis not present

## 2023-12-07 DIAGNOSIS — J3089 Other allergic rhinitis: Secondary | ICD-10-CM | POA: Diagnosis not present

## 2023-12-07 DIAGNOSIS — J3081 Allergic rhinitis due to animal (cat) (dog) hair and dander: Secondary | ICD-10-CM | POA: Diagnosis not present

## 2023-12-16 DIAGNOSIS — J3089 Other allergic rhinitis: Secondary | ICD-10-CM | POA: Diagnosis not present

## 2023-12-16 DIAGNOSIS — J301 Allergic rhinitis due to pollen: Secondary | ICD-10-CM | POA: Diagnosis not present

## 2023-12-16 DIAGNOSIS — J3081 Allergic rhinitis due to animal (cat) (dog) hair and dander: Secondary | ICD-10-CM | POA: Diagnosis not present

## 2023-12-23 DIAGNOSIS — J3089 Other allergic rhinitis: Secondary | ICD-10-CM | POA: Diagnosis not present

## 2023-12-23 DIAGNOSIS — J301 Allergic rhinitis due to pollen: Secondary | ICD-10-CM | POA: Diagnosis not present

## 2023-12-23 DIAGNOSIS — J3081 Allergic rhinitis due to animal (cat) (dog) hair and dander: Secondary | ICD-10-CM | POA: Diagnosis not present

## 2023-12-29 DIAGNOSIS — J3081 Allergic rhinitis due to animal (cat) (dog) hair and dander: Secondary | ICD-10-CM | POA: Diagnosis not present

## 2023-12-29 DIAGNOSIS — J301 Allergic rhinitis due to pollen: Secondary | ICD-10-CM | POA: Diagnosis not present

## 2023-12-29 DIAGNOSIS — J3089 Other allergic rhinitis: Secondary | ICD-10-CM | POA: Diagnosis not present

## 2024-01-05 DIAGNOSIS — J3089 Other allergic rhinitis: Secondary | ICD-10-CM | POA: Diagnosis not present

## 2024-01-05 DIAGNOSIS — J301 Allergic rhinitis due to pollen: Secondary | ICD-10-CM | POA: Diagnosis not present

## 2024-01-05 DIAGNOSIS — J3081 Allergic rhinitis due to animal (cat) (dog) hair and dander: Secondary | ICD-10-CM | POA: Diagnosis not present

## 2024-01-12 DIAGNOSIS — E782 Mixed hyperlipidemia: Secondary | ICD-10-CM | POA: Diagnosis not present

## 2024-01-12 DIAGNOSIS — Z0000 Encounter for general adult medical examination without abnormal findings: Secondary | ICD-10-CM | POA: Diagnosis not present

## 2024-01-12 DIAGNOSIS — E559 Vitamin D deficiency, unspecified: Secondary | ICD-10-CM | POA: Diagnosis not present

## 2024-01-12 DIAGNOSIS — R7309 Other abnormal glucose: Secondary | ICD-10-CM | POA: Diagnosis not present

## 2024-01-12 DIAGNOSIS — J3081 Allergic rhinitis due to animal (cat) (dog) hair and dander: Secondary | ICD-10-CM | POA: Diagnosis not present

## 2024-01-12 DIAGNOSIS — J301 Allergic rhinitis due to pollen: Secondary | ICD-10-CM | POA: Diagnosis not present

## 2024-01-12 DIAGNOSIS — J3089 Other allergic rhinitis: Secondary | ICD-10-CM | POA: Diagnosis not present

## 2024-01-18 DIAGNOSIS — J301 Allergic rhinitis due to pollen: Secondary | ICD-10-CM | POA: Diagnosis not present

## 2024-01-18 DIAGNOSIS — J3081 Allergic rhinitis due to animal (cat) (dog) hair and dander: Secondary | ICD-10-CM | POA: Diagnosis not present

## 2024-01-18 DIAGNOSIS — J3089 Other allergic rhinitis: Secondary | ICD-10-CM | POA: Diagnosis not present

## 2024-01-25 DIAGNOSIS — R7309 Other abnormal glucose: Secondary | ICD-10-CM | POA: Diagnosis not present

## 2024-01-25 DIAGNOSIS — Z1331 Encounter for screening for depression: Secondary | ICD-10-CM | POA: Diagnosis not present

## 2024-01-25 DIAGNOSIS — E559 Vitamin D deficiency, unspecified: Secondary | ICD-10-CM | POA: Diagnosis not present

## 2024-01-25 DIAGNOSIS — Z6833 Body mass index (BMI) 33.0-33.9, adult: Secondary | ICD-10-CM | POA: Diagnosis not present

## 2024-01-25 DIAGNOSIS — E782 Mixed hyperlipidemia: Secondary | ICD-10-CM | POA: Diagnosis not present

## 2024-01-25 DIAGNOSIS — Z0001 Encounter for general adult medical examination with abnormal findings: Secondary | ICD-10-CM | POA: Diagnosis not present

## 2024-01-25 DIAGNOSIS — E6609 Other obesity due to excess calories: Secondary | ICD-10-CM | POA: Diagnosis not present

## 2024-01-26 DIAGNOSIS — J301 Allergic rhinitis due to pollen: Secondary | ICD-10-CM | POA: Diagnosis not present

## 2024-01-26 DIAGNOSIS — J3081 Allergic rhinitis due to animal (cat) (dog) hair and dander: Secondary | ICD-10-CM | POA: Diagnosis not present

## 2024-01-26 DIAGNOSIS — J3089 Other allergic rhinitis: Secondary | ICD-10-CM | POA: Diagnosis not present

## 2024-02-08 DIAGNOSIS — J3089 Other allergic rhinitis: Secondary | ICD-10-CM | POA: Diagnosis not present

## 2024-02-08 DIAGNOSIS — J301 Allergic rhinitis due to pollen: Secondary | ICD-10-CM | POA: Diagnosis not present

## 2024-02-08 DIAGNOSIS — J3081 Allergic rhinitis due to animal (cat) (dog) hair and dander: Secondary | ICD-10-CM | POA: Diagnosis not present

## 2024-02-24 DIAGNOSIS — J301 Allergic rhinitis due to pollen: Secondary | ICD-10-CM | POA: Diagnosis not present

## 2024-02-24 DIAGNOSIS — J3081 Allergic rhinitis due to animal (cat) (dog) hair and dander: Secondary | ICD-10-CM | POA: Diagnosis not present

## 2024-02-24 DIAGNOSIS — J3089 Other allergic rhinitis: Secondary | ICD-10-CM | POA: Diagnosis not present

## 2024-03-08 DIAGNOSIS — J3081 Allergic rhinitis due to animal (cat) (dog) hair and dander: Secondary | ICD-10-CM | POA: Diagnosis not present

## 2024-03-08 DIAGNOSIS — J3089 Other allergic rhinitis: Secondary | ICD-10-CM | POA: Diagnosis not present

## 2024-03-08 DIAGNOSIS — J301 Allergic rhinitis due to pollen: Secondary | ICD-10-CM | POA: Diagnosis not present

## 2024-03-28 DIAGNOSIS — J3081 Allergic rhinitis due to animal (cat) (dog) hair and dander: Secondary | ICD-10-CM | POA: Diagnosis not present

## 2024-03-28 DIAGNOSIS — J301 Allergic rhinitis due to pollen: Secondary | ICD-10-CM | POA: Diagnosis not present

## 2024-03-28 DIAGNOSIS — J3089 Other allergic rhinitis: Secondary | ICD-10-CM | POA: Diagnosis not present

## 2024-04-12 DIAGNOSIS — J3081 Allergic rhinitis due to animal (cat) (dog) hair and dander: Secondary | ICD-10-CM | POA: Diagnosis not present

## 2024-04-12 DIAGNOSIS — J301 Allergic rhinitis due to pollen: Secondary | ICD-10-CM | POA: Diagnosis not present

## 2024-04-12 DIAGNOSIS — J3089 Other allergic rhinitis: Secondary | ICD-10-CM | POA: Diagnosis not present

## 2024-04-17 DIAGNOSIS — J3081 Allergic rhinitis due to animal (cat) (dog) hair and dander: Secondary | ICD-10-CM | POA: Diagnosis not present

## 2024-04-17 DIAGNOSIS — J3089 Other allergic rhinitis: Secondary | ICD-10-CM | POA: Diagnosis not present

## 2024-04-17 DIAGNOSIS — J301 Allergic rhinitis due to pollen: Secondary | ICD-10-CM | POA: Diagnosis not present

## 2024-05-18 DIAGNOSIS — J301 Allergic rhinitis due to pollen: Secondary | ICD-10-CM | POA: Diagnosis not present

## 2024-05-18 DIAGNOSIS — J3081 Allergic rhinitis due to animal (cat) (dog) hair and dander: Secondary | ICD-10-CM | POA: Diagnosis not present

## 2024-05-18 DIAGNOSIS — J3089 Other allergic rhinitis: Secondary | ICD-10-CM | POA: Diagnosis not present

## 2024-06-08 DIAGNOSIS — J301 Allergic rhinitis due to pollen: Secondary | ICD-10-CM | POA: Diagnosis not present

## 2024-06-08 DIAGNOSIS — J3089 Other allergic rhinitis: Secondary | ICD-10-CM | POA: Diagnosis not present

## 2024-06-08 DIAGNOSIS — J3081 Allergic rhinitis due to animal (cat) (dog) hair and dander: Secondary | ICD-10-CM | POA: Diagnosis not present

## 2024-07-05 ENCOUNTER — Other Ambulatory Visit (HOSPITAL_COMMUNITY): Payer: Self-pay | Admitting: Family Medicine

## 2024-07-05 DIAGNOSIS — Z1231 Encounter for screening mammogram for malignant neoplasm of breast: Secondary | ICD-10-CM

## 2024-08-02 ENCOUNTER — Ambulatory Visit (HOSPITAL_COMMUNITY)
Admission: RE | Admit: 2024-08-02 | Discharge: 2024-08-02 | Disposition: A | Source: Ambulatory Visit | Attending: Family Medicine | Admitting: Family Medicine

## 2024-08-02 DIAGNOSIS — Z1231 Encounter for screening mammogram for malignant neoplasm of breast: Secondary | ICD-10-CM
